# Patient Record
Sex: Male | Born: 1990 | Race: White | Hispanic: No | Marital: Single | Smoking: Current every day smoker
Health system: Southern US, Community
[De-identification: ages and names within clinical notes are randomized; demographics above are authoritative.]

## PROBLEM LIST (undated history)

## (undated) DIAGNOSIS — J45909 Unspecified asthma, uncomplicated: Secondary | ICD-10-CM

---
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
## 2012-11-29 ENCOUNTER — Emergency Department (HOSPITAL_COMMUNITY): Payer: Self-pay

## 2012-11-29 ENCOUNTER — Encounter (HOSPITAL_COMMUNITY): Payer: Self-pay | Admitting: *Deleted

## 2012-11-29 ENCOUNTER — Emergency Department (HOSPITAL_COMMUNITY)
Admission: EM | Admit: 2012-11-29 | Discharge: 2012-11-29 | Disposition: A | Discharge status: Home / Self Care | Payer: Self-pay | Attending: Emergency Medicine | Admitting: Emergency Medicine

## 2012-11-29 DIAGNOSIS — S4980XA Other specified injuries of shoulder and upper arm, unspecified arm, initial encounter: Secondary | ICD-10-CM | POA: Insufficient documentation

## 2012-11-29 DIAGNOSIS — S46909A Unspecified injury of unspecified muscle, fascia and tendon at shoulder and upper arm level, unspecified arm, initial encounter: Secondary | ICD-10-CM | POA: Insufficient documentation

## 2012-11-29 DIAGNOSIS — S8990XA Unspecified injury of unspecified lower leg, initial encounter: Secondary | ICD-10-CM | POA: Insufficient documentation

## 2012-11-29 DIAGNOSIS — Y9389 Activity, other specified: Secondary | ICD-10-CM | POA: Insufficient documentation

## 2012-11-29 DIAGNOSIS — M25469 Effusion, unspecified knee: Secondary | ICD-10-CM | POA: Insufficient documentation

## 2012-11-29 DIAGNOSIS — R269 Unspecified abnormalities of gait and mobility: Secondary | ICD-10-CM | POA: Insufficient documentation

## 2012-11-29 DIAGNOSIS — Y9289 Other specified places as the place of occurrence of the external cause: Secondary | ICD-10-CM | POA: Insufficient documentation

## 2012-11-29 DIAGNOSIS — S42009A Fracture of unspecified part of unspecified clavicle, initial encounter for closed fracture: Secondary | ICD-10-CM | POA: Insufficient documentation

## 2012-11-29 DIAGNOSIS — F17200 Nicotine dependence, unspecified, uncomplicated: Secondary | ICD-10-CM | POA: Insufficient documentation

## 2012-11-29 MED ORDER — OXYCODONE-ACETAMINOPHEN 5-325 MG PO TABS: 1.0000 | ORAL_TABLET | ORAL | Status: DC | PRN

## 2012-11-29 NOTE — ED Notes (Addendum)
Pt educated on hypertension and strongly advised a pcp. Did not want w/c
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
## 2012-11-29 NOTE — ED Provider Notes (Signed)
Medical screening examination/treatment/procedure(s) were conducted as a shared visit with non-physician practitioner(s) and myself.  I personally evaluated the patient during the encounter  Orthopedic follow up. Sling and knee immobilizer for comfort. No signs of infection. Deep abrasion to knee, no indication to close. Local wound care instructions given to pt by me personally.   Lyanne Co, MD 11/29/12 [phone]
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
## 2014-10-19 ENCOUNTER — Emergency Department (HOSPITAL_COMMUNITY): Payer: Self-pay

## 2014-10-19 ENCOUNTER — Encounter (HOSPITAL_COMMUNITY): Payer: Self-pay | Admitting: Emergency Medicine

## 2014-10-19 ENCOUNTER — Inpatient Hospital Stay (HOSPITAL_COMMUNITY)
Admission: EM | Admit: 2014-10-19 | Discharge: 2014-10-21 | DRG: 853 | Discharge status: Against Medical Advice | Payer: Self-pay | Attending: Internal Medicine | Admitting: Internal Medicine

## 2014-10-19 DIAGNOSIS — F1510 Other stimulant abuse, uncomplicated: Secondary | ICD-10-CM | POA: Diagnosis present

## 2014-10-19 DIAGNOSIS — T79A0XA Compartment syndrome, unspecified, initial encounter: Secondary | ICD-10-CM | POA: Diagnosis present

## 2014-10-19 DIAGNOSIS — B999 Unspecified infectious disease: Secondary | ICD-10-CM

## 2014-10-19 DIAGNOSIS — M726 Necrotizing fasciitis: Secondary | ICD-10-CM | POA: Diagnosis present

## 2014-10-19 DIAGNOSIS — A419 Sepsis, unspecified organism: Principal | ICD-10-CM | POA: Diagnosis present

## 2014-10-19 DIAGNOSIS — M609 Myositis, unspecified: Secondary | ICD-10-CM | POA: Diagnosis present

## 2014-10-19 DIAGNOSIS — J45909 Unspecified asthma, uncomplicated: Secondary | ICD-10-CM | POA: Diagnosis present

## 2014-10-19 DIAGNOSIS — F1020 Alcohol dependence, uncomplicated: Secondary | ICD-10-CM | POA: Diagnosis present

## 2014-10-19 DIAGNOSIS — F1210 Cannabis abuse, uncomplicated: Secondary | ICD-10-CM | POA: Diagnosis present

## 2014-10-19 DIAGNOSIS — F1910 Other psychoactive substance abuse, uncomplicated: Secondary | ICD-10-CM | POA: Diagnosis present

## 2014-10-19 DIAGNOSIS — F1410 Cocaine abuse, uncomplicated: Secondary | ICD-10-CM | POA: Diagnosis present

## 2014-10-19 DIAGNOSIS — R062 Wheezing: Secondary | ICD-10-CM

## 2014-10-19 DIAGNOSIS — Y909 Presence of alcohol in blood, level not specified: Secondary | ICD-10-CM | POA: Diagnosis present

## 2014-10-19 DIAGNOSIS — F17210 Nicotine dependence, cigarettes, uncomplicated: Secondary | ICD-10-CM | POA: Diagnosis present

## 2014-10-19 DIAGNOSIS — L03113 Cellulitis of right upper limb: Secondary | ICD-10-CM

## 2014-10-19 HISTORY — DX: Unspecified asthma, uncomplicated: J45.909

## 2014-10-19 LAB — COMPREHENSIVE METABOLIC PANEL
ALK PHOS: 66 U/L (ref 39–117)
ALT: 97 U/L — ABNORMAL HIGH (ref 0–53)
ANION GAP: 11 (ref 5–15)
AST: 67 U/L — ABNORMAL HIGH (ref 0–37)
Albumin: 3.9 g/dL (ref 3.5–5.2)
BILIRUBIN TOTAL: 0.9 mg/dL (ref 0.3–1.2)
BUN: 8 mg/dL (ref 6–23)
CHLORIDE: 102 meq/L (ref 96–112)
CO2: 23 mmol/L (ref 19–32)
CREATININE: 0.75 mg/dL (ref 0.50–1.35)
Calcium: 9.5 mg/dL (ref 8.4–10.5)
GLUCOSE: 122 mg/dL — AB (ref 70–99)
POTASSIUM: 4.4 mmol/L (ref 3.5–5.1)
Sodium: 136 mmol/L (ref 135–145)
Total Protein: 7.3 g/dL (ref 6.0–8.3)

## 2014-10-19 LAB — CBC WITH DIFFERENTIAL/PLATELET
Basophils Absolute: 0 10*3/uL (ref 0.0–0.1)
Basophils Relative: 0 % (ref 0–1)
EOS PCT: 0 % (ref 0–5)
Eosinophils Absolute: 0 10*3/uL (ref 0.0–0.7)
HEMATOCRIT: 44.1 % (ref 39.0–52.0)
Hemoglobin: 15.1 g/dL (ref 13.0–17.0)
LYMPHS ABS: 1.1 10*3/uL (ref 0.7–4.0)
Lymphocytes Relative: 4 % — ABNORMAL LOW (ref 12–46)
MCH: 31.7 pg (ref 26.0–34.0)
MCHC: 34.2 g/dL (ref 30.0–36.0)
MCV: 92.5 fL (ref 78.0–100.0)
MONO ABS: 1.9 10*3/uL — AB (ref 0.1–1.0)
MONOS PCT: 7 % (ref 3–12)
NEUTROS ABS: 24.6 10*3/uL — AB (ref 1.7–7.7)
Neutrophils Relative %: 89 % — ABNORMAL HIGH (ref 43–77)
PLATELETS: 312 10*3/uL (ref 150–400)
RBC: 4.77 MIL/uL (ref 4.22–5.81)
RDW: 13.4 % (ref 11.5–15.5)
WBC: 27.6 10*3/uL — AB (ref 4.0–10.5)

## 2014-10-19 LAB — LACTIC ACID, PLASMA: LACTIC ACID, VENOUS: 3.1 mmol/L — AB (ref 0.5–2.2)

## 2014-10-19 LAB — CK: Total CK: 80 U/L (ref 7–232)

## 2014-10-19 MED ORDER — PIPERACILLIN-TAZOBACTAM 3.375 G IVPB 30 MIN
3.3750 g | Freq: Once | INTRAVENOUS | Status: AC
Start: 2014-10-19 — End: 2014-10-19
  Administered 2014-10-19: 3.375 g via INTRAVENOUS
  Filled 2014-10-19: qty 50

## 2014-10-19 MED ORDER — ONDANSETRON HCL 4 MG/2ML IJ SOLN
4.0000 mg | Freq: Once | INTRAMUSCULAR | Status: AC
  Administered 2014-10-19: 4 mg via INTRAVENOUS
  Filled 2014-10-19: qty 2

## 2014-10-19 MED ORDER — HYDROMORPHONE HCL 1 MG/ML IJ SOLN
1.0000 mg | Freq: Once | INTRAMUSCULAR | Status: AC
  Administered 2014-10-19: 1 mg via INTRAVENOUS
  Filled 2014-10-19: qty 1

## 2014-10-19 MED ORDER — MORPHINE SULFATE 4 MG/ML IJ SOLN
4.0000 mg | Freq: Once | INTRAMUSCULAR | Status: AC
  Administered 2014-10-19: 4 mg via INTRAVENOUS
  Filled 2014-10-19: qty 1

## 2014-10-19 MED ORDER — VANCOMYCIN HCL IN DEXTROSE 1-5 GM/200ML-% IV SOLN
1000.0000 mg | Freq: Once | INTRAVENOUS | Status: AC
  Administered 2014-10-19: 1000 mg via INTRAVENOUS
  Filled 2014-10-19: qty 200

## 2014-10-19 MED ORDER — GADOBENATE DIMEGLUMINE 529 MG/ML IV SOLN
10.0000 mL | Freq: Once | INTRAVENOUS | Status: AC | PRN
  Administered 2014-10-19: 10 mL via INTRAVENOUS

## 2014-10-19 MED ORDER — MORPHINE SULFATE 10 MG/ML IJ SOLN
10.0000 mg | Freq: Once | INTRAMUSCULAR | Status: AC
  Administered 2014-10-19: 10 mg via INTRAVENOUS
  Filled 2014-10-19: qty 1

## 2014-10-19 MED ORDER — MORPHINE SULFATE 2 MG/ML IJ SOLN
2.0000 mg | Freq: Once | INTRAMUSCULAR | Status: AC
  Administered 2014-10-19: 2 mg via INTRAVENOUS
  Filled 2014-10-19: qty 1
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
## 2014-10-19 NOTE — ED Provider Notes (Signed)
Face-to-face evaluation   History: He complains of swelling and pain in his right arm after being injected with "salt solution" one week ago.  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  His friends thought that the saline injection would help him.  He has had gradually worse pain and swelling, since that time.   Physical exam: Alert, cooperative, uncomfortable.  Right arm is diffusely swollen from the midhumerus, to the wrist.  There is significant tenderness and erythema, medially, from the mid forearm to the midhumerus.  He has pain with extension and flexion of the right arm.  His neurovascular intact distally in the right.  There is firm induration of the medial proximal forearm and the medial distal humerus.     Medications  morphine injection 10 mg (10 mg Intravenous Given 10/19/14 1731)  ondansetron (ZOFRAN) injection 4 mg (4 mg Intravenous Given 10/19/14 1731)  HYDROmorphone (DILAUDID) injection 1 mg (1 mg Intravenous Given 10/19/14 1927)  vancomycin (VANCOCIN) IVPB 1000 mg/200 mL premix (0 mg Intravenous Stopped 10/19/14 2112)  piperacillin-tazobactam (ZOSYN) IVPB 3.375 g (0 g Intravenous Stopped 10/19/14 2007)  gadobenate dimeglumine (MULTIHANCE) injection 10 mL (10 mLs Intravenous Contrast Given 10/19/14 2215)  morphine 2 MG/ML injection 2 mg (2 mg Intravenous Given 10/19/14 2231)  morphine 4 MG/ML injection 4 mg (4 mg Intravenous Given 10/19/14 2303)    Patient Vitals for the past 24 hrs:  BP Temp Temp src Pulse Resp SpO2 Height Weight  10/19/14 2230 111/80 mmHg - - 107 - 100 % - -  10/19/14 2220 134/100 mmHg - - 100 - 98 % - -  10/19/14 1800 - - - 93 - 100 % - -  10/19/14 1745 - - - 96 - 100 % - -  10/19/14 1450 126/92 mmHg 99.1 F (37.3 C) Oral 115 18 100 % 5\' 4"  (1.626 m) 135 lb (61.236 kg)   Results for orders placed or performed during the hospital encounter of 10/19/14  CBC with Differential  Result Value Ref Range   WBC 27.6 (H) 4.0 - 10.5 K/uL   RBC 4.77  4.22 - 5.81 MIL/uL   Hemoglobin 15.1 13.0 - 17.0 g/dL   HCT 16.144.1 09.639.0 - 04.552.0 %   MCV 92.5 78.0 - 100.0 fL   MCH 31.7 26.0 - 34.0 pg   MCHC 34.2 30.0 - 36.0 g/dL   RDW 40.913.4 81.111.5 - 91.415.5 %   Platelets 312 150 - 400 K/uL   Neutrophils Relative % 89 (H) 43 - 77 %   Lymphocytes Relative 4 (L) 12 - 46 %   Monocytes Relative 7 3 - 12 %   Eosinophils Relative 0 0 - 5 %   Basophils Relative 0 0 - 1 %   Neutro Abs 24.6 (H) 1.7 - 7.7 K/uL   Lymphs Abs 1.1 0.7 - 4.0 K/uL   Monocytes Absolute 1.9 (H) 0.1 - 1.0 K/uL   Eosinophils Absolute 0.0 0.0 - 0.7 K/uL   Basophils Absolute 0.0 0.0 - 0.1 K/uL   WBC Morphology WHITE COUNT CONFIRMED ON SMEAR    Smear Review LARGE PLATELETS PRESENT   Comprehensive metabolic panel  Result Value Ref Range   Sodium 136 135 - 145 mmol/L   Potassium 4.4 3.5 - 5.1 mmol/L   Chloride 102 96 - 112 mEq/L   CO2 23 19 - 32 mmol/L   Glucose, Bld 122 (H) 70 - 99 mg/dL   BUN 8 6 - 23 mg/dL   Creatinine, Ser 7.820.75 0.50 - 1.35 mg/dL  Calcium 9.5 8.4 - 10.5 mg/dL   Total Protein 7.3 6.0 - 8.3 g/dL   Albumin 3.9 3.5 - 5.2 g/dL   AST 67 (H) 0 - 37 U/L   ALT 97 (H) 0 - 53 U/L   Alkaline Phosphatase 66 39 - 117 U/L   Total Bilirubin 0.9 0.3 - 1.2 mg/dL   GFR calc non Af Amer >90 >90 mL/min   GFR calc Af Amer >90 >90 mL/min   Anion gap 11 5 - 15  Lactic acid, plasma  Result Value Ref Range   Lactic Acid, Venous 3.1 (H) 0.5 - 2.2 mmol/L  CK  Result Value Ref Range   Total CK 80 7 - 232 U/L    Dg Chest 2 View  10/19/2014   CLINICAL DATA:  Wheezing.  Right arm pain and swelling.  EXAM: CHEST  2 VIEW  COMPARISON:  None.  FINDINGS: The heart size and mediastinal contours are within normal limits. Both lungs are clear. No evidence of pleural effusion. Old right clavicle fracture deformity incidentally noted.  IMPRESSION: No active cardiopulmonary disease.   Electronically Signed   By: Myles RosenthalJohn  Stahl M.D.   On: 10/19/2014 17:43   Dg Forearm Right  10/19/2014   CLINICAL DATA:   Right arm pain, swelling, and erythema. IV drug use.  EXAM: RIGHT FOREARM - 2 VIEW  COMPARISON:  None.  FINDINGS: Subcutaneous emphysema is seen in the proximal forearm. No evidence of osteolysis or periostitis. No fracture or other bone lesion seen involving the radius or ulna. No radiopaque foreign body identified.  IMPRESSION: Subcutaneous emphysema and proximal forearm. No osseous abnormality visualized.   Electronically Signed   By: Myles RosenthalJohn  Stahl M.D.   On: 10/19/2014 17:49    11:38 PM Reevaluation with update and discussion. After initial assessment and treatment, an updated evaluation reveals he is more comfortable at this time.  Patient updated on findings.Mancel Bale. Diva Lemberger L   23:50- [facility] [place]lyn LotKuzma; who agrees to evaluate the patient and likely take him to the operating room for operative management of necrotizing fasciitis.  He requests that the patient be admitted to a hospitalists service   00:10- according to Dr. Sharl MaLama, the hospitalist needs to be consulted after an operative procedure, which apparently, Dr. Merlyn LotKuzma is planning on doing.   CRITICAL CARE Performed by: Flint MelterWENTZ,Jeramia Saleeby L Total critical care time: 50 minutes Critical care time was exclusive of separately billable procedures and treating other patients. Critical care was necessary to treat or prevent imminent or life-threatening deterioration. Critical care was time spent personally by me on the following activities: development of treatment plan with patient and/or surrogate as well as nursing, discussions with consultants, evaluation of patient's response to treatment, examination of patient, obtaining history from patient or surrogate, ordering and performing treatments and interventions, ordering and review of laboratory studies, ordering and review of radiographic studies, pulse oximetry and re-evaluation of patient's condition.  Medical screening  examination/treatment/procedure(s) were conducted as a shared visit with non-physician practitioner(s) and myself.  I personally evaluated the patient during the encounter  Flint MelterElliott L Veona Bittman, MD 10/21/14 1154

## 2014-10-19 NOTE — ED Provider Notes (Signed)
CSN: [id number]     Arrival date & time 10/19/14  1445 History   First MD Initiated Contact with Patient 10/19/14 1639     Chief Complaint  Patient presents with  . Wound Infection     (Consider location/radiation/quality/duration/timing/severity/associated sxs/prior Treatment) HPI  Jared SangerRobert Cates is 23 y.o. 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 Initial evaluation by Ivery QualeHobson Bryant, PAC. Patient awaiting MRI of his RUE. Continues to complain of pain and request pain medication. He has had Morphine for pain and continues to complain of pain. 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 Dr. Effie ShyWentz has ordered additional medication for pain @ 1930. Dilaudid 1 mg IV.   Past Medical History  Diagnosis Date  . Asthma    History reviewed. No pertinent past surgical history. History reviewed. No pertinent family history. History  Substance Use Topics  . Smoking status: Current Every Day Smoker -- 1.00 packs/day    Types: Cigarettes  . Smokeless tobacco: Current User  . Alcohol Use: Yes     Comment: Pt reports he drinks about a hundred dollars wotrth of beer a week    Review of Systems As stated previously  Allergies  Review of patient's allergies indicates no known allergies.  Home Medications   Prior to Admission medications   Medication Sig Start Date End Date Taking? Authorizing Provider  ibuprofen (ADVIL,MOTRIN) 200 MG tablet Take 1,000 mg by mouth every 6 (six) hours as needed. For pain   Yes Historical Provider, MD  oxyCODONE-acetaminophen (PERCOCET/ROXICET) 5-325 MG per tablet Take 1 tablet by mouth every 4 (four) hours as needed for pain. Patient not taking: Reported on 10/19/2014 11/29/12   Janne NapoleonHope M Taytum Scheck, NP   BP 126/92 mmHg  Pulse 93  Temp(Src) 99.1 F (37.3 C) (Oral)  Resp 18  Ht 5\' 4"  (1.626 m)  Wt 135 lb (61.236 kg)  BMI 23.16 kg/m2  SpO2 100% Physical Exam  Constitutional: He is oriented to  person, place, and time. He appears well-developed and well-nourished. No distress.  HENT:  Head: Normocephalic and atraumatic.  Eyes: EOM are normal.  Neck: Neck supple.  Cardiovascular:  Slightly elevated BP, patient tachycardic on arrival but since then HR normal.   Pulmonary/Chest: Effort normal.  Abdominal: Soft. There is no tenderness.  Musculoskeletal:  Right forearm with cellulitis as described by Ivery QualeHobson Bryant in previous note.   Neurological: He is alert and oriented to person, place, and time. No cranial nerve deficit.  Skin: Skin is warm and dry.  Psychiatric: He has a normal mood and affect. His behavior is normal.  Nursing note and vitals reviewed.   ED Course  Procedures (including critical care time) Results for orders placed or performed during the hospital encounter of 10/19/14 (from the past 24 hour(s))  CBC with Differential     Status: Abnormal   Collection Time: 10/19/14  5:10 PM  Result Value Ref Range   WBC 27.6 (H) 4.0 - 10.5 K/uL   RBC 4.77 4.22 - 5.81 MIL/uL   Hemoglobin 15.1 13.0 - 17.0 g/dL   HCT 08.644.1 57.839.0 - 46.952.0 %   MCV 92.5 78.0 - 100.0 fL   MCH 31.7 26.0 - 34.0 pg   MCHC 34.2 30.0 - 36.0 g/dL   RDW 62.913.4 52.811.5 - 41.315.5 %   Platelets 312 150 - 400 K/uL   Neutrophils Relative % 89 (H) 43 - 77 %  Lymphocytes Relative 4 (L) 12 - 46 %   Monocytes Relative 7 3 - 12 %   Eosinophils Relative 0 0 - 5 %   Basophils Relative 0 0 - 1 %   Neutro Abs 24.6 (H) 1.7 - 7.7 K/uL   Lymphs Abs 1.1 0.7 - 4.0 K/uL   Monocytes Absolute 1.9 (H) 0.1 - 1.0 K/uL   Eosinophils Absolute 0.0 0.0 - 0.7 K/uL   Basophils Absolute 0.0 0.0 - 0.1 K/uL   WBC Morphology WHITE COUNT CONFIRMED ON SMEAR    Smear Review LARGE PLATELETS PRESENT   Comprehensive metabolic panel     Status: Abnormal   Collection Time: 10/19/14  5:10 PM  Result Value Ref Range   Sodium 136 135 - 145 mmol/L   Potassium 4.4 3.5 - 5.1 mmol/L   Chloride 102 96 - 112 mEq/L   CO2 23 19 - 32 mmol/L    Glucose, Bld 122 (H) 70 - 99 mg/dL   BUN 8 6 - 23 mg/dL   Creatinine, Ser 9.600.75 0.50 - 1.35 mg/dL   Calcium 9.5 8.4 - 45.410.5 mg/dL   Total Protein 7.3 6.0 - 8.3 g/dL   Albumin 3.9 3.5 - 5.2 g/dL   AST 67 (H) 0 - 37 U/L   ALT 97 (H) 0 - 53 U/L   Alkaline Phosphatase 66 39 - 117 U/L   Total Bilirubin 0.9 0.3 - 1.2 mg/dL   GFR calc non Af Amer >90 >90 mL/min   GFR calc Af Amer >90 >90 mL/min   Anion gap 11 5 - 15  CK     Status: None   Collection Time: 10/19/14  5:10 PM  Result Value Ref Range   Total CK 80 7 - 232 U/L  Lactic acid, plasma     Status: Abnormal   Collection Time: 10/19/14  6:10 PM  Result Value Ref Range   Lactic Acid, Venous 3.1 (H) 0.5 - 2.2 mmol/L  Blood cultures pending,    Imaging Review Dg Chest 2 View  10/19/2014   CLINICAL DATA:  Wheezing.  Right arm pain and swelling.  EXAM: CHEST  2 VIEW  COMPARISON:  None.  FINDINGS: The heart size and mediastinal contours are within normal limits. Both lungs are clear. No evidence of pleural effusion. Old right clavicle fracture deformity incidentally noted.  IMPRESSION: No active cardiopulmonary disease.   Electronically Signed   By: Myles RosenthalJohn  Stahl M.D.   On: 10/19/2014 17:43   Dg Forearm Right  10/19/2014   CLINICAL DATA:  Right arm pain, swelling, and erythema. IV drug use.  EXAM: RIGHT FOREARM - 2 VIEW  COMPARISON:  None.  FINDINGS: Subcutaneous emphysema is seen in the proximal forearm. No evidence of osteolysis or periostitis. No fracture or other bone lesion seen involving the radius or ulna. No radiopaque foreign body identified.  IMPRESSION: Subcutaneous emphysema and proximal forearm. No osseous abnormality visualized.   Electronically Signed   By: Myles RosenthalJohn  Stahl M.D.   On: 10/19/2014 17:49   Patient continues to complain of severe pain to his right arm. Continue pain management. Patient states that the Morphine worked better than the dilaudid for his pain.     MDM  Dr. Effie ShyWentz reviewed the MR report and the patient has  necrotizing fasciitis. [facility]. Patient remains neurovascularly intact at this time.      [id number]Kalijah Zeiss NassawadoxM Kaelee Pfeffer, NP 10/19/14 2346  Flint MelterElliott L Wentz, MD 10/21/14 1155

## 2014-10-20 ENCOUNTER — Encounter (HOSPITAL_COMMUNITY)
Admission: EM | Discharge status: Against Medical Advice | Payer: Self-pay | Source: Home / Self Care | Attending: Internal Medicine

## 2014-10-20 ENCOUNTER — Emergency Department (HOSPITAL_COMMUNITY): Payer: Self-pay | Admitting: Certified Registered"

## 2014-10-20 ENCOUNTER — Encounter (HOSPITAL_COMMUNITY): Payer: Self-pay | Admitting: Internal Medicine

## 2014-10-20 DIAGNOSIS — M726 Necrotizing fasciitis: Secondary | ICD-10-CM

## 2014-10-20 DIAGNOSIS — L03113 Cellulitis of right upper limb: Secondary | ICD-10-CM

## 2014-10-20 DIAGNOSIS — A419 Sepsis, unspecified organism: Principal | ICD-10-CM

## 2014-10-20 DIAGNOSIS — F1910 Other psychoactive substance abuse, uncomplicated: Secondary | ICD-10-CM

## 2014-10-20 HISTORY — PX: I&D EXTREMITY: SHX5045

## 2014-10-20 LAB — CBC WITH DIFFERENTIAL/PLATELET
BASOS PCT: 0 % (ref 0–1)
Basophils Absolute: 0 10*3/uL (ref 0.0–0.1)
EOS ABS: 0 10*3/uL (ref 0.0–0.7)
Eosinophils Relative: 0 % (ref 0–5)
HCT: 40.3 % (ref 39.0–52.0)
Hemoglobin: 13.6 g/dL (ref 13.0–17.0)
Lymphocytes Relative: 5 % — ABNORMAL LOW (ref 12–46)
Lymphs Abs: 0.9 10*3/uL (ref 0.7–4.0)
MCH: 30.8 pg (ref 26.0–34.0)
MCHC: 33.7 g/dL (ref 30.0–36.0)
MCV: 91.2 fL (ref 78.0–100.0)
Monocytes Absolute: 1.4 10*3/uL — ABNORMAL HIGH (ref 0.1–1.0)
Monocytes Relative: 7 % (ref 3–12)
NEUTROS PCT: 88 % — AB (ref 43–77)
Neutro Abs: 18.1 10*3/uL — ABNORMAL HIGH (ref 1.7–7.7)
PLATELETS: 250 10*3/uL (ref 150–400)
RBC: 4.42 MIL/uL (ref 4.22–5.81)
RDW: 13.3 % (ref 11.5–15.5)
WBC: 20.5 10*3/uL — ABNORMAL HIGH (ref 4.0–10.5)

## 2014-10-20 LAB — GRAM STAIN

## 2014-10-20 LAB — COMPREHENSIVE METABOLIC PANEL
ALK PHOS: 62 U/L (ref 39–117)
ALT: 67 U/L — AB (ref 0–53)
AST: 38 U/L — ABNORMAL HIGH (ref 0–37)
Albumin: 2.9 g/dL — ABNORMAL LOW (ref 3.5–5.2)
Anion gap: 9 (ref 5–15)
BUN: 5 mg/dL — ABNORMAL LOW (ref 6–23)
CALCIUM: 8.3 mg/dL — AB (ref 8.4–10.5)
CO2: 22 mmol/L (ref 19–32)
Chloride: 100 mEq/L (ref 96–112)
Creatinine, Ser: 0.8 mg/dL (ref 0.50–1.35)
Glucose, Bld: 131 mg/dL — ABNORMAL HIGH (ref 70–99)
POTASSIUM: 3.6 mmol/L (ref 3.5–5.1)
SODIUM: 131 mmol/L — AB (ref 135–145)
Total Bilirubin: 0.3 mg/dL (ref 0.3–1.2)
Total Protein: 5.6 g/dL — ABNORMAL LOW (ref 6.0–8.3)

## 2014-10-20 LAB — HEPATITIS PANEL, ACUTE
HCV Ab: NEGATIVE
Hep A IgM: NONREACTIVE
Hep B C IgM: NONREACTIVE
Hepatitis B Surface Ag: NEGATIVE

## 2014-10-20 LAB — HIV ANTIBODY (ROUTINE TESTING W REFLEX): HIV: NONREACTIVE

## 2014-10-20 LAB — CK: CK TOTAL: 210 U/L (ref 7–232)

## 2014-10-20 SURGERY — IRRIGATION AND DEBRIDEMENT EXTREMITY
Anesthesia: General | Site: Arm Lower | Laterality: Right

## 2014-10-20 MED ORDER — LORAZEPAM 2 MG/ML IJ SOLN: 0.0000 mg | Freq: Two times a day (BID) | INTRAMUSCULAR | Status: DC

## 2014-10-20 MED ORDER — LORAZEPAM 1 MG PO TABS
1.0000 mg | ORAL_TABLET | Freq: Four times a day (QID) | ORAL | Status: DC | PRN
  Administered 2014-10-20 (×2): 1 mg via ORAL
  Filled 2014-10-20 (×2): qty 1

## 2014-10-20 MED ORDER — OXYCODONE HCL 5 MG PO TABS
10.0000 mg | ORAL_TABLET | Freq: Four times a day (QID) | ORAL | Status: DC | PRN
  Administered 2014-10-20 – 2014-10-21 (×3): 10 mg via ORAL
  Filled 2014-10-20 (×3): qty 2

## 2014-10-20 MED ORDER — OXYCODONE HCL 5 MG/5ML PO SOLN: 5.0000 mg | Freq: Once | ORAL | Status: AC | PRN

## 2014-10-20 MED ORDER — OXYCODONE HCL 5 MG PO TABS
5.0000 mg | ORAL_TABLET | Freq: Once | ORAL | Status: AC | PRN
  Administered 2014-10-20: 5 mg via ORAL

## 2014-10-20 MED ORDER — HYDROMORPHONE HCL 1 MG/ML IJ SOLN
0.2500 mg | INTRAMUSCULAR | Status: DC | PRN
  Administered 2014-10-20 (×2): 0.5 mg via INTRAVENOUS

## 2014-10-20 MED ORDER — LORAZEPAM 2 MG/ML IJ SOLN
INTRAMUSCULAR | Status: AC
  Administered 2014-10-20: 1 mg
  Filled 2014-10-20: qty 1

## 2014-10-20 MED ORDER — PIPERACILLIN-TAZOBACTAM 3.375 G IVPB
3.3750 g | Freq: Three times a day (TID) | INTRAVENOUS | Status: DC
  Administered 2014-10-20 – 2014-10-21 (×4): 3.375 g via INTRAVENOUS
  Filled 2014-10-20 (×7): qty 50

## 2014-10-20 MED ORDER — GLYCOPYRROLATE 0.2 MG/ML IJ SOLN
INTRAMUSCULAR | Status: DC | PRN
  Administered 2014-10-20: .6 mg via INTRAVENOUS

## 2014-10-20 MED ORDER — MORPHINE SULFATE 2 MG/ML IJ SOLN
2.0000 mg | INTRAMUSCULAR | Status: DC | PRN
  Administered 2014-10-20 – 2014-10-21 (×7): 2 mg via INTRAVENOUS
  Filled 2014-10-20 (×8): qty 1

## 2014-10-20 MED ORDER — LORAZEPAM 2 MG/ML IJ SOLN
0.0000 mg | Freq: Four times a day (QID) | INTRAMUSCULAR | Status: DC
  Administered 2014-10-20: 1 mg via INTRAVENOUS
  Administered 2014-10-21: 2 mg via INTRAVENOUS
  Filled 2014-10-20 (×2): qty 1

## 2014-10-20 MED ORDER — SODIUM CHLORIDE 0.9 % IV SOLN
INTRAVENOUS | Status: DC
  Administered 2014-10-20: 07:00:00 via INTRAVENOUS

## 2014-10-20 MED ORDER — ONDANSETRON HCL 4 MG/2ML IJ SOLN
INTRAMUSCULAR | Status: DC | PRN
  Administered 2014-10-20: 4 mg via INTRAVENOUS

## 2014-10-20 MED ORDER — ADULT MULTIVITAMIN W/MINERALS CH
1.0000 | ORAL_TABLET | Freq: Every day | ORAL | Status: DC
  Administered 2014-10-20: 1 via ORAL
  Filled 2014-10-20 (×2): qty 1

## 2014-10-20 MED ORDER — PROMETHAZINE HCL 25 MG/ML IJ SOLN: 6.2500 mg | INTRAMUSCULAR | Status: DC | PRN

## 2014-10-20 MED ORDER — LORAZEPAM 2 MG/ML IJ SOLN: 1.0000 mg | Freq: Four times a day (QID) | INTRAMUSCULAR | Status: DC | PRN

## 2014-10-20 MED ORDER — LACTATED RINGERS IV SOLN
INTRAVENOUS | Status: DC | PRN
  Administered 2014-10-20: 04:00:00 via INTRAVENOUS

## 2014-10-20 MED ORDER — ONDANSETRON HCL 4 MG/2ML IJ SOLN: 4.0000 mg | Freq: Four times a day (QID) | INTRAMUSCULAR | Status: DC | PRN

## 2014-10-20 MED ORDER — FENTANYL CITRATE 0.05 MG/ML IJ SOLN
INTRAMUSCULAR | Status: DC | PRN
  Administered 2014-10-20 (×4): 100 ug via INTRAVENOUS
  Administered 2014-10-20: 150 ug via INTRAVENOUS
  Administered 2014-10-20: 50 ug via INTRAVENOUS

## 2014-10-20 MED ORDER — SODIUM CHLORIDE 0.9 % IR SOLN
Status: DC | PRN
  Administered 2014-10-20 (×2): 1000 mL
  Administered 2014-10-20: 3000 mL

## 2014-10-20 MED ORDER — SUCCINYLCHOLINE CHLORIDE 20 MG/ML IJ SOLN
INTRAMUSCULAR | Status: DC | PRN
  Administered 2014-10-20: 170 mg via INTRAVENOUS

## 2014-10-20 MED ORDER — ONDANSETRON HCL 4 MG PO TABS: 4.0000 mg | ORAL_TABLET | Freq: Four times a day (QID) | ORAL | Status: DC | PRN

## 2014-10-20 MED ORDER — MIDAZOLAM HCL 2 MG/2ML IJ SOLN
INTRAMUSCULAR | Status: DC | PRN
  Administered 2014-10-20: 2 mg via INTRAVENOUS

## 2014-10-20 MED ORDER — ACETAMINOPHEN 650 MG RE SUPP: 650.0000 mg | Freq: Four times a day (QID) | RECTAL | Status: DC | PRN

## 2014-10-20 MED ORDER — ROCURONIUM BROMIDE 100 MG/10ML IV SOLN
INTRAVENOUS | Status: DC | PRN
  Administered 2014-10-20: 40 mg via INTRAVENOUS

## 2014-10-20 MED ORDER — VITAMIN B-1 100 MG PO TABS
100.0000 mg | ORAL_TABLET | Freq: Every day | ORAL | Status: DC
  Administered 2014-10-20: 100 mg via ORAL
  Filled 2014-10-20 (×2): qty 1

## 2014-10-20 MED ORDER — LIDOCAINE HCL (CARDIAC) 20 MG/ML IV SOLN
INTRAVENOUS | Status: DC | PRN
  Administered 2014-10-20: 80 mg via INTRAVENOUS

## 2014-10-20 MED ORDER — ACETAMINOPHEN 325 MG PO TABS: 650.0000 mg | ORAL_TABLET | Freq: Four times a day (QID) | ORAL | Status: DC | PRN

## 2014-10-20 MED ORDER — SODIUM CHLORIDE 0.9 % IV SOLN
INTRAVENOUS | Status: DC | PRN
  Administered 2014-10-20: 02:00:00 via INTRAVENOUS

## 2014-10-20 MED ORDER — HYDROMORPHONE HCL 1 MG/ML IJ SOLN
1.0000 mg | Freq: Once | INTRAMUSCULAR | Status: AC
  Administered 2014-10-20: 1 mg via INTRAVENOUS
  Filled 2014-10-20: qty 1

## 2014-10-20 MED ORDER — NEOSTIGMINE METHYLSULFATE 10 MG/10ML IV SOLN
INTRAVENOUS | Status: DC | PRN
  Administered 2014-10-20: 4 mg via INTRAVENOUS

## 2014-10-20 MED ORDER — VANCOMYCIN HCL IN DEXTROSE 1-5 GM/200ML-% IV SOLN
1000.0000 mg | Freq: Three times a day (TID) | INTRAVENOUS | Status: DC
  Administered 2014-10-20 – 2014-10-21 (×4): 1000 mg via INTRAVENOUS
  Filled 2014-10-20 (×6): qty 200

## 2014-10-20 MED ORDER — FOLIC ACID 1 MG PO TABS
1.0000 mg | ORAL_TABLET | Freq: Every day | ORAL | Status: DC
  Administered 2014-10-20: 1 mg via ORAL
  Filled 2014-10-20 (×2): qty 1

## 2014-10-20 MED ORDER — THIAMINE HCL 100 MG/ML IJ SOLN
100.0000 mg | Freq: Every day | INTRAMUSCULAR | Status: DC
  Filled 2014-10-20: qty 1

## 2014-10-20 MED ORDER — PROPOFOL 10 MG/ML IV BOLUS
INTRAVENOUS | Status: DC | PRN
  Administered 2014-10-20: 170 mg via INTRAVENOUS
  Administered 2014-10-20: 30 mg via INTRAVENOUS

## 2014-10-20 SURGICAL SUPPLY — 55 items
BANDAGE COBAN STERILE 2 (GAUZE/BANDAGES/DRESSINGS) IMPLANT
BANDAGE ELASTIC 3 VELCRO ST LF (GAUZE/BANDAGES/DRESSINGS) ×3 IMPLANT
BANDAGE ELASTIC 4 VELCRO ST LF (GAUZE/BANDAGES/DRESSINGS) ×3 IMPLANT
BNDG COHESIVE 1X5 TAN STRL LF (GAUZE/BANDAGES/DRESSINGS) IMPLANT
BNDG CONFORM 2 STRL LF (GAUZE/BANDAGES/DRESSINGS) IMPLANT
BNDG ESMARK 4X9 LF (GAUZE/BANDAGES/DRESSINGS) IMPLANT
BNDG GAUZE ELAST 4 BULKY (GAUZE/BANDAGES/DRESSINGS) ×6 IMPLANT
CORDS BIPOLAR (ELECTRODE) ×3 IMPLANT
COVER SURGICAL LIGHT HANDLE (MISCELLANEOUS) ×3 IMPLANT
DECANTER SPIKE VIAL GLASS SM (MISCELLANEOUS) IMPLANT
DRAIN PENROSE 1/4X12 LTX STRL (WOUND CARE) IMPLANT
DRSG ADAPTIC 3X8 NADH LF (GAUZE/BANDAGES/DRESSINGS) IMPLANT
DRSG EMULSION OIL 3X3 NADH (GAUZE/BANDAGES/DRESSINGS) ×3 IMPLANT
DRSG PAD ABDOMINAL 8X10 ST (GAUZE/BANDAGES/DRESSINGS) ×3 IMPLANT
GAUZE IODOFORM PACK 1/2 7832 (GAUZE/BANDAGES/DRESSINGS) ×3 IMPLANT
GAUZE SPONGE 4X4 12PLY STRL (GAUZE/BANDAGES/DRESSINGS) ×6 IMPLANT
GAUZE XEROFORM 1X8 LF (GAUZE/BANDAGES/DRESSINGS) ×3 IMPLANT
GLOVE BIO SURGEON STRL SZ7.5 (GLOVE) ×3 IMPLANT
GLOVE BIOGEL M 8.0 STRL (GLOVE) ×3 IMPLANT
GLOVE BIOGEL PI IND STRL 8 (GLOVE) ×1 IMPLANT
GLOVE BIOGEL PI INDICATOR 8 (GLOVE) ×2
GOWN STRL REIN XL XLG (GOWN DISPOSABLE) ×3 IMPLANT
GOWN STRL REUS W/ TWL XL LVL3 (GOWN DISPOSABLE) ×1 IMPLANT
GOWN STRL REUS W/TWL XL LVL3 (GOWN DISPOSABLE) ×2
HANDPIECE INTERPULSE COAX TIP (DISPOSABLE)
KIT BASIN OR (CUSTOM PROCEDURE TRAY) ×3 IMPLANT
KIT ROOM TURNOVER OR (KITS) ×3 IMPLANT
LOOP VESSEL MAXI BLUE (MISCELLANEOUS) IMPLANT
LOOP VESSEL MINI RED (MISCELLANEOUS) IMPLANT
MANIFOLD NEPTUNE II (INSTRUMENTS) ×3 IMPLANT
NEEDLE HYPO 25X1 1.5 SAFETY (NEEDLE) IMPLANT
NS IRRIG 1000ML POUR BTL (IV SOLUTION) ×3 IMPLANT
PACK ORTHO EXTREMITY (CUSTOM PROCEDURE TRAY) ×3 IMPLANT
PAD ARMBOARD 7.5X6 YLW CONV (MISCELLANEOUS) ×6 IMPLANT
SCRUB BETADINE 4OZ XXX (MISCELLANEOUS) ×3 IMPLANT
SET CYSTO W/LG BORE CLAMP LF (SET/KITS/TRAYS/PACK) ×3 IMPLANT
SET HNDPC FAN SPRY TIP SCT (DISPOSABLE) IMPLANT
SOLUTION BETADINE 4OZ (MISCELLANEOUS) ×3 IMPLANT
SPLINT FIBERGLASS 4X30 (CAST SUPPLIES) ×3 IMPLANT
SPONGE LAP 18X18 X RAY DECT (DISPOSABLE) ×3 IMPLANT
SPONGE LAP 4X18 X RAY DECT (DISPOSABLE) ×3 IMPLANT
SUCTION FRAZIER TIP 10 FR DISP (SUCTIONS) ×3 IMPLANT
SUT ETHILON 2 0 FS 18 (SUTURE) ×3 IMPLANT
SUT ETHILON 4 0 PS 2 18 (SUTURE) IMPLANT
SUT MON AB 5-0 P3 18 (SUTURE) IMPLANT
SYR CONTROL 10ML LL (SYRINGE) IMPLANT
TOWEL OR 17X24 6PK STRL BLUE (TOWEL DISPOSABLE) ×3 IMPLANT
TOWEL OR 17X26 10 PK STRL BLUE (TOWEL DISPOSABLE) ×3 IMPLANT
TUBE ANAEROBIC SPECIMEN COL (MISCELLANEOUS) ×3 IMPLANT
TUBE CONNECTING 12'X1/4 (SUCTIONS) ×1
TUBE CONNECTING 12X1/4 (SUCTIONS) ×2 IMPLANT
TUBE FEEDING 5FR 15 INCH (TUBING) IMPLANT
UNDERPAD 30X30 INCONTINENT (UNDERPADS AND DIAPERS) ×3 IMPLANT
WATER STERILE IRR 1000ML POUR (IV SOLUTION) ×3 IMPLANT
YANKAUER SUCT BULB TIP NO VENT (SUCTIONS) ×3 IMPLANT

## 2014-10-20 NOTE — ED Notes (Signed)
Dr Kuzma at bedside. 

## 2014-10-20 NOTE — H&P (Signed)
[name] is an 23 y.o. male.   Chief Complaint: right arm infection HPI: 23 yo rhd male states he has had worsening issues with right arm starting 1 week ago.  OD'd while using heroin and friends injected a salt solution into his right arm.  Has had progressive pain, swelling, erythema.  Seen at [facility] today where he was felt to have necrotizing fasciitis.  MRI and XR show air in forearm and distal upper arm as well as myositis.  Reports fever yesterday.  No chills, night sweats.  Major complaint is pain in right arm.  Previous abscess in left arm that he drained himself.  Past Medical History  Diagnosis Date  . Asthma     History reviewed. No pertinent past surgical history.  History reviewed. No pertinent family history. Social History:  reports that he has been smoking Cigarettes.  He has been smoking about 1.00 pack per day. He uses smokeless tobacco. He reports that he drinks alcohol. He reports that he uses illicit drugs (Cocaine, Marijuana, Methamphetamines, and IV).  Allergies: No Known Allergies   (Not in a hospital admission)  Results for orders placed or performed during the hospital encounter of 10/19/14 (from the past 48 hour(s))  CBC with Differential     Status: Abnormal   Collection Time: 10/19/14  5:10 PM  Result Value Ref Range   WBC 27.6 (H) 4.0 - 10.5 K/uL   RBC 4.77 4.22 - 5.81 MIL/uL   Hemoglobin 15.1 13.0 - 17.0 g/dL   HCT 44.1 39.0 - 52.0 %   MCV 92.5 78.0 - 100.0 fL   MCH 31.7 26.0 - 34.0 pg   MCHC 34.2 30.0 - 36.0 g/dL   RDW 13.4 11.5 - 15.5 %   Platelets 312 150 - 400 K/uL   Neutrophils Relative % 89 (H) 43 - 77 %   Lymphocytes Relative 4 (L) 12 - 46 %   Monocytes Relative 7 3 - 12 %   Eosinophils Relative 0 0 - 5 %   Basophils Relative 0 0 - 1 %   Neutro Abs 24.6 (H) 1.7 - 7.7 K/uL   Lymphs Abs 1.1 0.7 - 4.0 K/uL   Monocytes Absolute 1.9 (H) 0.1 - 1.0 K/uL   Eosinophils Absolute 0.0 0.0 - 0.7 K/uL   Basophils Absolute 0.0 0.0 - 0.1 K/uL   WBC  Morphology WHITE COUNT CONFIRMED ON SMEAR     Comment: MILD LEFT SHIFT (1-5% METAS, OCC MYELO, OCC BANDS) ATYPICAL LYMPHOCYTES VACUOLATED NEUTROPHILS    Smear Review LARGE PLATELETS PRESENT   Comprehensive metabolic panel     Status: Abnormal   Collection Time: 10/19/14  5:10 PM  Result Value Ref Range   Sodium 136 135 - 145 mmol/L    Comment: Please note change in reference range.   Potassium 4.4 3.5 - 5.1 mmol/L    Comment: Please note change in reference range.   Chloride 102 96 - 112 mEq/L   CO2 23 19 - 32 mmol/L   Glucose, Bld 122 (H) 70 - 99 mg/dL   BUN 8 6 - 23 mg/dL   Creatinine, Ser 0.75 0.50 - 1.35 mg/dL   Calcium 9.5 8.4 - 10.5 mg/dL   Total Protein 7.3 6.0 - 8.3 g/dL   Albumin 3.9 3.5 - 5.2 g/dL   AST 67 (H) 0 - 37 U/L   ALT 97 (H) 0 - 53 U/L   Alkaline Phosphatase 66 39 - 117 U/L   Total Bilirubin 0.9 0.3 - 1.2  mg/dL   GFR calc non Af Amer >90 >90 mL/min   GFR calc Af Amer >90 >90 mL/min    Comment: (NOTE) The eGFR has been calculated using the CKD EPI equation. This calculation has not been validated in all clinical situations. eGFR's persistently <90 mL/min signify possible Chronic Kidney Disease.    Anion gap 11 5 - 15  CK     Status: None   Collection Time: 10/19/14  5:10 PM  Result Value Ref Range   Total CK 80 7 - 232 U/L  Lactic acid, plasma     Status: Abnormal   Collection Time: 10/19/14  6:10 PM  Result Value Ref Range   Lactic Acid, Venous 3.1 (H) 0.5 - 2.2 mmol/L    Dg Chest 2 View  10/19/2014   CLINICAL DATA:  Wheezing.  Right arm pain and swelling.  EXAM: CHEST  2 VIEW  COMPARISON:  None.  FINDINGS: The heart size and mediastinal contours are within normal limits. Both lungs are clear. No evidence of pleural effusion. Old right clavicle fracture deformity incidentally noted.  IMPRESSION: No active cardiopulmonary disease.   Electronically Signed   By: Earle Gell M.D.   On: 10/19/2014 17:43   Dg Forearm Right  10/19/2014   CLINICAL DATA:   Right arm pain, swelling, and erythema. IV drug use.  EXAM: RIGHT FOREARM - 2 VIEW  COMPARISON:  None.  FINDINGS: Subcutaneous emphysema is seen in the proximal forearm. No evidence of osteolysis or periostitis. No fracture or other bone lesion seen involving the radius or ulna. No radiopaque foreign body identified.  IMPRESSION: Subcutaneous emphysema and proximal forearm. No osseous abnormality visualized.   Electronically Signed   By: Earle Gell M.D.   On: 10/19/2014 17:49     A comprehensive review of systems was negative except for: Constitutional: positive for fevers  Blood pressure 132/77, pulse 104, temperature 100 F (37.8 C), temperature source Oral, resp. rate 20, height _0  (1.626 m), weight 61.236 kg (135 lb), SpO2 93 %.  General appearance: alert, cooperative and appears stated age Head: Normocephalic, without obvious abnormality, atraumatic Neck: supple, symmetrical, trachea midline Resp: clear to auscultation bilaterally Cardio: regular rate and rhythm GI: non tender Extremities: intact sensation and capillary refill all digits.  +epl/fpl/io.  able to move digits without significant pain.  hand/forearm/upper arm with swelling, erythema, warmth.  distal upper arm and proximal forearm tense. Pulses: 2+ and symmetric Skin: Skin color, texture, turgor normal. No rashes or lesions Neurologic: Grossly normal Incision/Wound: none  Assessment/Plan Right arm infection, possible necrotizing fasciitis.  Recommend OR for incision and drainage of hand/forearm/upper arm likely both volarly and dorsally.  Risks, benefits, and alternatives of surgery were discussed and the patient agrees with the plan of care.  Will plan admission to medical service post operatively for IVABx and medical management.   Gabino Hagin R 10/20/2014, 2:09 AM

## 2014-10-20 NOTE — Op Note (Signed)
NAMNorva Pavlov:  [name], [name]               ACCOUNT NO.:  [id number]  MEDICAL RECORD NO.:  [id number]  LOCATION:  [facility]                         FACILITY:  [facility]  PHYSICIAN:  Jared LoaKevin Bentli Llorente, MD        DATE OF BIRTH:  04-12-1991  DATE OF PROCEDURE:  10/20/2014 DATE OF DISCHARGE:                              OPERATIVE REPORT   PREOPERATIVE DIAGNOSIS:  Right forearm necrotizing fasciitis and upper arm necrotizing fasciitis.  POSTOPERATIVE DIAGNOSIS:  Right forearm and upper arm necrotizing fasciitis and compartment syndrome.  PROCEDURE:  Right forearm and upper arm incision and drainage and fasciotomy of volar forearm and volar upper arm.  SURGEON:  Jared LoaKevin Emali Heyward, MD.  ASSISTANT:  [name], M.D.  ANESTHESIA:  General.  IV FLUIDS:  Per anesthesia flow sheet.  ESTIMATED BLOOD LOSS:  Minimal.  COMPLICATIONS:  None.  SPECIMENS:  Cultures to micro.  TOURNIQUET TIME:  65 minutes.  DISPOSITION:  Stable to PACU.  INDICATIONS:  [name] is a 23 year old male, who states approximately a week ago he was using heroin when he overdosed and his friends gave him an injection of salt water to try and help him.  He has had increasing pain, swelling, and erythema of the right arm.  [facility] was [facility] at [facility], where he was evaluated and felt to have necrotizing fasciitis.  [facility].  On examination, he had a swollen erythematous right forearm and upper arm.  He was very tense.  He was tender to palpation.  I recommended incision and drainage and release of his compartments. Risks, benefits, and alternatives of surgery were discussed including the risk of blood loss, infection, damage to nerves, vessels, tendons, ligaments, bone; failure of surgery; need for additional surgery, complications with wound healing, continued pain, continued infection, need for repeat irrigation and debridement.  He voiced understanding of these risks and  elected to proceed.  OPERATIVE COURSE:  After being identified preoperatively by myself, the patient and I agreed upon procedure and site procedure.  Surgical site was marked.  The risks, benefits, and alternatives of surgery were reviewed and he wished to proceed.  Surgical consent had been signed. He was transported to the operating room and placed on the operating room table in supine position with the right upper extremity on arm board.  General anesthesia was induced by anesthesiologist.  The right upper extremity was prepped and draped in normal sterile orthopedic fashion.  Surgical pause was performed between surgeons, anesthesia, and operating room staff, and all were in agreement as to the patient procedure and site of procedure.  Tourniquet at the proximal aspect of the arm was inflated to 250 mmHg after exsanguination of the limb with an Esmarch bandage.  A incision was made on the forearm down the ulnar side for release of compartments.  This was extended across the antecubital fossa and up the radial side of the upper arm during the case.  The bipolar electrocautery and Bovie were used to obtain hemostasis.  The fascia was released.  The muscle was tense underneath it and decompressed.  The volar flap was elevated.  The more radial side of  the forearm gross purulence was encountered.  It was malodorous.  The muscle was damaged looking.  Some was necrotic.  Necrotic portions of muscle were debrided using rongeurs and pickups.  The purulence appeared to course along the course of the pronator teres and the radial artery. It also coursed into the biceps tendon and tracked along the biceps tendon.  The purulence also was found to course down to the proximal aspect of the radius.  Once all areas of purulence had been felt to be found, the wound was copiously irrigated with 4000 mL of sterile saline by cysto tubing.  The area was again debrided of any devitalized muscle. The volar  compartment of the forearm and the volar compartment of the upper arm had been released.  The abscess cavities were packed with 0.5 inch Iodoform gauze.  Two 2-0 nylon sutures were placed at the corners of the wound to prevent wound edge retraction.  The wounds were dressed with sterile 4x4s, ABDs and wrapped with a Kerlix bandage.  Posterior splint at the elbow was placed and wrapped with Kerlix and Ace bandage. The tourniquet was deflated at 65 minutes.  Fingertips were pink with brisk capillary refill after deflation of the tourniquet.  The operative drapes were broken down.  The patient was awoken from anesthesia safely. He was transferred back to stretcher and taken to PACU in stable condition.  He will be admitted by the hospitalist for medical care and antibiotics.  He will return to the operating room in 24-48 hours for repeat irrigation, debridement and dressing change.  He will be started on vancomycin and Zosyn.  Pain medications will be ordered by the admitting service.     Jared LoaKevin Dalinda Heidt, MD     KK/MEDQ  D:  10/20/2014  T:  10/20/2014  Job:  [id number]

## 2014-10-20 NOTE — Transfer of Care (Signed)
Immediate Anesthesia Transfer of Care Note  Patient: Jared SangerRobert [name]  Procedure(s) Performed: Procedure(s): INCISION AND DRAINAGE RIGHT ARM, FASCIOTOMY RIGHT FOREARM (Right)  Patient Location: PACU  Anesthesia Type:General  Level of Consciousness: awake, alert  and oriented  Airway & Oxygen Therapy: Patient Spontanous Breathing  Post-op Assessment: Report given to PACU RN, Post -op Vital signs reviewed and stable and Patient moving all extremities X 4  Post vital signs: Reviewed and stable  Complications: No apparent anesthesia complications

## 2014-10-20 NOTE — Anesthesia Procedure Notes (Signed)
Procedure Name: Intubation Date/Time: 10/20/2014 2:32 AM Performed by: Molli HazardGORDON, Brady Schiller M Pre-anesthesia Checklist: Patient identified, Emergency Drugs available, Suction available and Patient being monitored Patient Re-evaluated:Patient Re-evaluated prior to inductionOxygen Delivery Method: Circle system utilized Preoxygenation: Pre-oxygenation with 100% oxygen Intubation Type: IV induction, Rapid sequence and Cricoid Pressure applied Laryngoscope Size: Miller and 2 Grade View: Grade I Tube type: Oral Tube size: 8.0 mm Number of attempts: 1 Airway Equipment and Method: Stylet Placement Confirmation: ETT inserted through vocal cords under direct vision,  positive ETCO2 and breath sounds checked- equal and bilateral Secured at: 24 cm Tube secured with: Tape Dental Injury: Teeth and Oropharynx as per pre-operative assessment

## 2014-10-20 NOTE — Progress Notes (Signed)
ANTIBIOTIC CONSULT NOTE - INITIAL  Pharmacy Consult for Vancomycin/Zosyn  Indication: Cellulitis  No Known Allergies  Patient Measurements: Height: 5\' 4"  (162.6 cm) Weight: 135 lb (61.236 kg) IBW/kg (Calculated) : 59.2  Vital Signs: Temp: 100.3 F (37.9 C) (12/23 0456) Temp Source: Oral (12/23 0456) BP: 140/83 mmHg (12/23 0456) Pulse Rate: 103 (12/23 0456)  Labs:  Recent Labs  10/19/14 1710  WBC 27.6*  HGB 15.1  PLT 312  CREATININE 0.75   Estimated Creatinine Clearance: 121.3 mL/min (by C-G formula based on Cr of 0.75).  Microbiology: Recent Results (from the past 720 hour(s))  Gram stain     Status: None   Collection Time: 10/20/14  2:58 AM  Result Value Ref Range Status   Specimen Description WOUND RIGHT FOREARM  Final   Special Requests PATIENT ON FOLLOWING ZOSYN VANCOMYCIN  Final   Gram Stain   Final    ABUNDANT WBC PRESENT,BOTH PMN AND MONONUCLEAR ABUNDANT GRAM NEGATIVE RODS RARE GRAM POSITIVE RODS FEW GRAM POSITIVE COCCI IN PAIR AND CHAINS RARE YEAST    Report Status 10/20/2014 FINAL  Final    Medical History: Past Medical History  Diagnosis Date  . Asthma      Assessment: 23 y/o M to start broad spectrum antibiotics for R-arm infection secondary to IVDA. Pt is s/p OR with I&D and fasciotomy. WBC markedly elevated at 27.6, renal function good, other labs as above.   Goal of Therapy:  Vancomycin trough level 15-20 mcg/ml  Plan:  -Vancomycin 1000 mg IV q8h -Zosyn 3.375G IV q8h to be infused over 4 hours -Trend WBC, temp, renal function  -Drug levels as indicated   Abran DukeLedford, Georgeanna Radziewicz 10/20/2014,5:58 AM

## 2014-10-20 NOTE — Anesthesia Postprocedure Evaluation (Signed)
Anesthesia Post Note  Patient: Jared SangerRobert Windsor  Procedure(s) Performed: Procedure(s) (LRB): INCISION AND DRAINAGE RIGHT ARM, FASCIOTOMY RIGHT FOREARM (Right)  Anesthesia type: general  Patient location: PACU  Post pain: Pain level controlled  Post assessment: Patient's Cardiovascular Status Stable  Last Vitals:  Filed Vitals:   10/20/14 0456  BP: 140/83  Pulse: 103  Temp: 37.9 C  Resp: 14    Post vital signs: Reviewed and stable  Level of consciousness: sedated  Complications: No apparent anesthesia complications

## 2014-10-20 NOTE — Progress Notes (Addendum)
[facility] PROGRESS NOTE  Jared SangerRobert [name] MVH:[id number] DOB: 04/29/1991 DOA: 10/19/2014 PCP: No primary care provider on file.  Assessment/Plan: 1. Right forearm necrotizing fasciitis/upper arm necrotizing fasciitis/compartment syndrome -Patient having a history of polysubstance abuse, injecting into his right arm, likely cause of his infection. -He underwent incision and drainage and fasciotomy of right forearm and upper arm on 10/20/2014. -Tolerated procedure well there no immediate complications. -Continue empiric IV antimicrobial therapy with Vancomycin and Zosyn -Grams to stain showing abundant gram-negative rods, few gram-positive cocci -Cultures are pending  2.  Sepsis -Present on admission, evidenced by a white count of 20,500, lactic acid of 3.1, heart rate of 111 -Secondary to right forearm necrotizing fasciitis -Status post ORIF and fasciotomy -Follow-up on blood cultures, continue empiric IV antimicrobial therapy with vancomycin and Zosyn  3.  History of alcohol abuse -He did not appear to have clinical signs or symptoms of acute withdrawal during my encounter this morning -Placed on CIWA protocol  4.  Polysubstance abuse -Social work consult  Code Status: Full code Family Communication:  Disposition Plan: Continue IV antimicrobial therapy, at discharge home when stable   Consultants:  Orthopedic surgery  Procedures:  Incision and drainage and fasciotomy of right forearm and upper arm on 10/20/2014.  Antibiotics:  Vancomycin  Zosyn  HPI/Subjective: Patient is a 23 year old male with a past medical history of IV drug abuse, alcoholism, who initially presented to the emergency room at [facility] on 10/19/2014 presenting with complaints of right upper extremity pain, erythema, swelling. Imaging studies performed at that facility showed subcutaneous emphysema as it was concern for fasciitis. Patient reported injecting to his right upper extremity.  He was transferred to [facility], taken to the OR where he underwent right forearm and upper arm incision and drainage and fasciotomy of volar forearm and volar upper arm, procedure performed by Dr. Merlyn LotKuzma. He tolerated procedure well. He was continued on empiric IV antibiotic therapy with Zosyn and Vancomycin.   Objective: Filed Vitals:   10/20/14 0456  BP: 140/83  Pulse: 103  Temp: 100.3 F (37.9 C)  Resp: 14    Intake/Output Summary (Last 24 hours) at 10/20/14 0916 Last data filed at 10/20/14 [id number]  Gross per 24 hour  Intake   1100 ml  Output      0 ml  Net   1100 ml   Filed Weights   10/19/14 1450  Weight: 61.236 kg (135 lb)    Exam:   General:  Patient is awake alert oriented  Cardiovascular: Regular rate and rhythm normal S1-S2  Respiratory: Normal respiratory effort, lungs are clear to auscultation bilaterally  Abdomen: Soft nontender nondistended  Musculoskeletal: Right upper extremity in a cast, there is some swelling and erythema over his right hand  Data Reviewed: Basic Metabolic Panel:  Recent Labs Lab 10/19/14 1710 10/20/14 0619  NA 136 131*  K 4.4 3.6  CL 102 100  CO2 23 22  GLUCOSE 122* 131*  BUN 8 5*  CREATININE 0.75 0.80  CALCIUM 9.5 8.3*   Liver Function Tests:  Recent Labs Lab 10/19/14 1710 10/20/14 0619  AST 67* 38*  ALT 97* 67*  ALKPHOS 66 62  BILITOT 0.9 0.3  PROT 7.3 5.6*  ALBUMIN 3.9 2.9*   No results for input(s): LIPASE, AMYLASE in the last 168 hours. No results for input(s): AMMONIA in the last 168 hours. CBC:  Recent Labs Lab 10/19/14 1710 10/20/14 0619  WBC 27.6* 20.5*  NEUTROABS 24.6* 18.1*  HGB 15.1 13.6  HCT 44.1 40.3  MCV 92.5 91.2  PLT 312 250   Cardiac Enzymes:  Recent Labs Lab 10/19/14 1710 10/20/14 0619  CKTOTAL 80 210   BNP (last 3 results) No results for input(s): PROBNP in the last 8760 hours. CBG: No results for input(s): GLUCAP in the last 168 hours.  Recent Results (from the  past 240 hour(s))  Wound culture     Status: None (Preliminary result)   Collection Time: 10/20/14  2:58 AM  Result Value Ref Range Status   Specimen Description WOUND RIGHT FOREARM  Final   Special Requests PATIENT ON FOLLOWING ZOSYN VANCOMYCIN  Final   Gram Stain   Final    ABUNDANT WBC PRESENT,BOTH PMN AND MONONUCLEAR NO SQUAMOUS EPITHELIAL CELLS SEEN ABUNDANT GRAM NEGATIVE RODS RARE GRAM POSITIVE RODS FEW GRAM POSITIVE COCCI IN PAIRS AND CHAINS    Culture PENDING  Incomplete   Report Status PENDING  Incomplete  Gram stain     Status: None   Collection Time: 10/20/14  2:58 AM  Result Value Ref Range Status   Specimen Description WOUND RIGHT FOREARM  Final   Special Requests PATIENT ON FOLLOWING ZOSYN VANCOMYCIN  Final   Gram Stain   Final    ABUNDANT WBC PRESENT,BOTH PMN AND MONONUCLEAR ABUNDANT GRAM NEGATIVE RODS RARE GRAM POSITIVE RODS FEW GRAM POSITIVE COCCI IN PAIR AND CHAINS RARE YEAST    Report Status 10/20/2014 FINAL  Final  Anaerobic culture     Status: None (Preliminary result)   Collection Time: 10/20/14  2:58 AM  Result Value Ref Range Status   Specimen Description WOUND RIGHT FOREARM  Final   Special Requests PATIENT ON FOLLOWING ZOSYN VANCOMYCIN  Final   Gram Stain   Final    ABUNDANT WBC PRESENT,BOTH PMN AND MONONUCLEAR NO SQUAMOUS EPITHELIAL CELLS SEEN ABUNDANT GRAM NEGATIVE RODS RARE GRAM POSITIVE RODS FEW GRAM POSITIVE COCCI IN PAIRS AND CHAINS    Culture PENDING  Incomplete   Report Status PENDING  Incomplete     Studies: Dg Chest 2 View  10/19/2014   CLINICAL DATA:  Wheezing.  Right arm pain and swelling.  EXAM: CHEST  2 VIEW  COMPARISON:  None.  FINDINGS: The heart size and mediastinal contours are within normal limits. Both lungs are clear. No evidence of pleural effusion. Old right clavicle fracture deformity incidentally noted.  IMPRESSION: No active cardiopulmonary disease.   Electronically Signed   By: Myles Rosenthal M.D.   On: 10/19/2014 17:43    Dg Forearm Right  10/19/2014   CLINICAL DATA:  Right arm pain, swelling, and erythema. IV drug use.  EXAM: RIGHT FOREARM - 2 VIEW  COMPARISON:  None.  FINDINGS: Subcutaneous emphysema is seen in the proximal forearm. No evidence of osteolysis or periostitis. No fracture or other bone lesion seen involving the radius or ulna. No radiopaque foreign body identified.  IMPRESSION: Subcutaneous emphysema and proximal forearm. No osseous abnormality visualized.   Electronically Signed   By: Myles Rosenthal M.D.   On: 10/19/2014 17:49   Mr Humerus Right W Wo Contrast  10/20/2014   CLINICAL DATA:  Redness, pain and swelling of the right upper extremity for several days which has worsened 10/19/2014. IV drug abuser.  EXAM: MRI OF THE RIGHT FOREARM WITHOUT AND WITH CONTRAST; MRI OF THE RIGHT HUMERUS WITHOUT AND WITH CONTRAST  TECHNIQUE: Multiplanar, multisequence MR imaging was performed both before and after administration of intravenous contrast.  CONTRAST:  10 mL MULTIHANCE GADOBENATE DIMEGLUMINE 529 MG/ML IV SOLN  COMPARISON:  Plain films of the right arm this same day.  FINDINGS: The study is somewhat limited due to large field-of-view. The patient's arm is in charge the patient's elbow is in 90 degrees of flexion also limiting the examination.  Subcutaneous edema and enhancement are identified consistent with cellulitis. Gas is seen tracking in the anterior soft tissues of the forearm and inferior upper arm. Gas is best visualized tracking between the supinator, extensor carpi radialis longus and pronator teres muscles. A rim enhancing fluid collection with air-fluid levels is identified centered in the pronator teres. Discrete measurement is difficult due to positioning but the fluid collection measures approximately 5.0 cm craniocaudal by up to 2.8 cm transverse by approximately 3.4 cm AP. There is no bone marrow signal abnormality to suggest osteomyelitis.  IMPRESSION: Findings consistent with cellulitis,  pyomyositis and fasciitis centered about the elbow with extending into the forearm. Abscess which appears to lie in the pronator teres is identified as described above. Negative for osteomyelitis.   Electronically Signed   By: Drusilla Kannerhomas  Dalessio M.D.   On: 10/20/2014 08:24   Mr Forearm Right Wo/w Cm  10/20/2014   CLINICAL DATA:  Redness, pain and swelling of the right upper extremity for several days which has worsened 10/19/2014. IV drug abuser.  EXAM: MRI OF THE RIGHT FOREARM WITHOUT AND WITH CONTRAST; MRI OF THE RIGHT HUMERUS WITHOUT AND WITH CONTRAST  TECHNIQUE: Multiplanar, multisequence MR imaging was performed both before and after administration of intravenous contrast.  CONTRAST:  10 mL MULTIHANCE GADOBENATE DIMEGLUMINE 529 MG/ML IV SOLN  COMPARISON:  Plain films of the right arm this same day.  FINDINGS: The study is somewhat limited due to large field-of-view. The patient's arm is in charge the patient's elbow is in 90 degrees of flexion also limiting the examination.  Subcutaneous edema and enhancement are identified consistent with cellulitis. Gas is seen tracking in the anterior soft tissues of the forearm and inferior upper arm. Gas is best visualized tracking between the supinator, extensor carpi radialis longus and pronator teres muscles. A rim enhancing fluid collection with air-fluid levels is identified centered in the pronator teres. Discrete measurement is difficult due to positioning but the fluid collection measures approximately 5.0 cm craniocaudal by up to 2.8 cm transverse by approximately 3.4 cm AP. There is no bone marrow signal abnormality to suggest osteomyelitis.  IMPRESSION: Findings consistent with cellulitis, pyomyositis and fasciitis centered about the elbow with extending into the forearm. Abscess which appears to lie in the pronator teres is identified as described above. Negative for osteomyelitis.   Electronically Signed   By: Drusilla Kannerhomas  Dalessio M.D.   On: 10/20/2014 08:24     Scheduled Meds: . folic acid  1 mg Oral Daily  . LORazepam  0-4 mg Intravenous Q6H   Followed by  . [START ON 10/22/2014] LORazepam  0-4 mg Intravenous Q12H  . multivitamin with minerals  1 tablet Oral Daily  . piperacillin-tazobactam (ZOSYN)  IV  3.375 g Intravenous 3 times per day  . thiamine  100 mg Oral Daily   Or  . thiamine  100 mg Intravenous Daily  . vancomycin  1,000 mg Intravenous Q8H   Continuous Infusions: . sodium chloride 125 mL/hr at 10/20/14 [id number]    Principal Problem:   Sepsis Active Problems:   Necrotizing fasciitis   Polysubstance abuse    Time spent:     Jeralyn BennettZAMORA, Vilma Will  [facility] Pager [phone]. If 7PM-7AM, please contact night-coverage at [email or web address], password [email or web address] 10/20/2014, 9:16  AM  LOS: 1 day

## 2014-10-20 NOTE — ED Notes (Signed)
Patient to SDS room 36

## 2014-10-20 NOTE — ED Notes (Signed)
Patient transferred to [facility] ER at this time. Patient wanted to smoke and it was not allowed. Patient verbalized loudly that he did not think it was right that he could not go outside and smoke. Patient was informed that smoking was not allowed on [facility] and that going to have surgery was more important than smoking since his medical problem was potentially severe enough for him to lose his arm or even his life. Patient [facility] and continued to be loud but was transported at this time.

## 2014-10-20 NOTE — Brief Op Note (Signed)
10/19/2014 - 10/20/2014  4:10 AM  PATIENT:  [name]  23 y.o. male  PRE-OPERATIVE DIAGNOSIS:  necrotizing fasciitis  POST-OPERATIVE DIAGNOSIS:  necrotizing fasciitis, compartment syndrome forearm and upper arm  PROCEDURE:  Procedure(s): INCISION AND DRAINAGE RIGHT ARM, FASCIOTOMY RIGHT FOREARM (Right) and upper arm  SURGEON:  Surgeon(s) and Role:    * Betha LoaKevin Leone Putman, MD - Primary    * Dairl PonderMatthew Weingold, MD - Assisting  PHYSICIAN ASSISTANT:   ASSISTANTS: Dairl PonderMatthew Weingold. MD   ANESTHESIA:   general  EBL:  Total I/O In: 900 [I.V.:900] Out: -   BLOOD ADMINISTERED:none  DRAINS: iodoform packing  LOCAL MEDICATIONS USED:  NONE  SPECIMEN:  Source of Specimen:  right forearm  DISPOSITION OF SPECIMEN:  micro  COUNTS:  YES  TOURNIQUET:   Total Tourniquet Time Documented: Upper Arm (Right) - 65 minutes Total: Upper Arm (Right) - 65 minutes   DICTATION: .Other Dictation: Dictation Number [id number]  PLAN OF CARE: Admit to inpatient , hospitalist service  PATIENT DISPOSITION:  PACU - hemodynamically stable.   Delay start of Pharmacological VTE agent (>24hrs) due to surgical blood loss or risk of bleeding: no

## 2014-10-20 NOTE — Progress Notes (Signed)
MD at bedside. 

## 2014-10-20 NOTE — Op Note (Signed)
[id number] 

## 2014-10-20 NOTE — Anesthesia Preprocedure Evaluation (Addendum)
Anesthesia Evaluation  Patient identified by MRN, date of birth, ID band Patient awake    Reviewed: Allergy & Precautions, H&P , NPO status , Patient's Chart, lab work & pertinent test results  Airway Mallampati: II  TM Distance: >3 FB Neck ROM: full    Dental  (+) Poor Dentition, Dental Advidsory Given   Pulmonary neg pulmonary ROS, asthma , Current Smoker,  breath sounds clear to auscultation        Cardiovascular negative cardio ROS  Rhythm:regular Rate:Normal     Neuro/Psych negative neurological ROS  negative psych ROS   GI/Hepatic negative GI ROS, Neg liver ROS,   Endo/Other  negative endocrine ROS  Renal/GU negative Renal ROS     Musculoskeletal   Abdominal   Peds  Hematology   Anesthesia Other Findings   Reproductive/Obstetrics negative OB ROS                           Anesthesia Physical Anesthesia Plan  ASA: II and emergent  Anesthesia Plan: General   Post-op Pain Management:    Induction: Intravenous, Rapid sequence and Cricoid pressure planned  Airway Management Planned: Oral ETT  Additional Equipment:   Intra-op Plan:   Post-operative Plan: Extubation in OR  Informed Consent:   Plan Discussed with: CRNA, Anesthesiologist and Surgeon  Anesthesia Plan Comments:         Anesthesia Quick Evaluation

## 2014-10-20 NOTE — ED Notes (Signed)
Patient stated NO WALLET or jewelry

## 2014-10-20 NOTE — H&P (Signed)
[facility] History and Physical  Jared SangerRobert Larimer ZOX:[id number] DOB: 06/24/1991 DOA: 10/19/2014  Referring physician: Dr. Merlyn LotKuzma. PCP: No primary care provider on file.  Chief Complaint: Right upper extremity pain and swelling.  HPI: Jared SangerRobert [name] is a 23 y.o. male with history of IV drug abuse, alcoholism presented [facility] the ER at [facility] with complaints of a right upper extremity. Patient had injected some salt solution into his forearm a few days ago and since then they have been having increasing swelling and pain which is gradually worsening. X-rays done showed subcutaneous emphysema concerning for transient fasciitis and patient was taken to the OR. Patient presently is status post debridement. Patient has been started on empiric antibiotics and admitted for further management of necrotizing fasciitis of the right upper extremity. Right upper extremities already under dressing. Denies any chest pain or shortness of breath nausea vomiting abdominal pain or diarrhea.   Review of Systems: As presented in the history of presenting illness, rest negative.  Past Medical History  Diagnosis Date  . Asthma    History reviewed. No pertinent past surgical history. Social History:  reports that he has been smoking Cigarettes.  He has been smoking about 1.00 pack per day. He uses smokeless tobacco. He reports that he drinks alcohol. He reports that he uses illicit drugs (Cocaine, Marijuana, Methamphetamines, and IV). Where does patient live home. Can patient participate in ADLs? Yes.  No Known Allergies  Family History:  Family History  Problem Relation Age of Onset  . Diabetes Mellitus II Neg Hx       Prior to Admission medications   Medication Sig Start Date End Date Taking? Authorizing Provider  ibuprofen (ADVIL,MOTRIN) 200 MG tablet Take 1,000 mg by mouth every 6 (six) hours as needed. For pain   Yes Historical Provider, MD  oxyCODONE-acetaminophen (PERCOCET/ROXICET) 5-325  MG per tablet Take 1 tablet by mouth every 4 (four) hours as needed for pain. Patient not taking: Reported on 10/19/2014 11/29/12   Janne NapoleonHope M Neese, NP    Physical Exam: Filed Vitals:   10/20/14 0415 10/20/14 0430 10/20/14 0445 10/20/14 0456  BP: 150/86 156/86 145/80 140/83  Pulse: 117 116 111 103  Temp:   97.7 F (36.5 C) 100.3 F (37.9 C)  TempSrc:    Oral  Resp: 18 17 13 14   Height:      Weight:      SpO2: 91% 92% 94% 94%     General:  Moderately built and nourished.  Eyes: Anicteric no pallor.  ENT: No discharge eyes nose or mouth.  Neck: No mass felt.  Cardiovascular: S1 and S2 heard.  Respiratory: No rhonchi or crepitations.  Abdomen: Soft nontender bowel sounds present.  Skin: No rash.  Musculoskeletal: Right upper extremity is dressing done status post surgery.  Psychiatric: Appears normal.  Neurologic: Alert and oriented to time place and person. Moves all extremities.  Labs on Admission:  Basic Metabolic Panel:  Recent Labs Lab 10/19/14 1710  NA 136  K 4.4  CL 102  CO2 23  GLUCOSE 122*  BUN 8  CREATININE 0.75  CALCIUM 9.5   Liver Function Tests:  Recent Labs Lab 10/19/14 1710  AST 67*  ALT 97*  ALKPHOS 66  BILITOT 0.9  PROT 7.3  ALBUMIN 3.9   No results for input(s): LIPASE, AMYLASE in the last 168 hours. No results for input(s): AMMONIA in the last 168 hours. CBC:  Recent Labs Lab 10/19/14 1710  WBC 27.6*  NEUTROABS 24.6*  HGB 15.1  HCT 44.1  MCV 92.5  PLT 312   Cardiac Enzymes:  Recent Labs Lab 10/19/14 1710  CKTOTAL 80    BNP (last 3 results) No results for input(s): PROBNP in the last 8760 hours. CBG: No results for input(s): GLUCAP in the last 168 hours.  Radiological Exams on Admission: Dg Chest 2 View  10/19/2014   CLINICAL DATA:  Wheezing.  Right arm pain and swelling.  EXAM: CHEST  2 VIEW  COMPARISON:  None.  FINDINGS: The heart size and mediastinal contours are within normal limits. Both lungs are clear.  No evidence of pleural effusion. Old right clavicle fracture deformity incidentally noted.  IMPRESSION: No active cardiopulmonary disease.   Electronically Signed   By: Myles RosenthalJohn  Stahl M.D.   On: 10/19/2014 17:43   Dg Forearm Right  10/19/2014   CLINICAL DATA:  Right arm pain, swelling, and erythema. IV drug use.  EXAM: RIGHT FOREARM - 2 VIEW  COMPARISON:  None.  FINDINGS: Subcutaneous emphysema is seen in the proximal forearm. No evidence of osteolysis or periostitis. No fracture or other bone lesion seen involving the radius or ulna. No radiopaque foreign body identified.  IMPRESSION: Subcutaneous emphysema and proximal forearm. No osseous abnormality visualized.   Electronically Signed   By: Myles RosenthalJohn  Stahl M.D.   On: 10/19/2014 17:49     Assessment/Plan Principal Problem:   Sepsis Active Problems:   Necrotizing fasciitis   Polysubstance abuse   1. Sepsis secondary to necrotizing fasciitis of the right upper extremity - I have discussed with Dr. Merlyn LotKuzma on call hand surgeon. Patient is status post surgery at this time and patient will be placed on vancomycin and Zosyn. Follow cultures. Continue with hydration. Pain relief medications. 2. Polysubstance abuse including IV drugs alcohol and tobacco - patient has been placed on alcohol withdrawal protocol using IV Ativan and thiamine. Social work consult for substance abuse. 3. Mildly elevated LFTs probably from alcoholism - check acute hepatitis panel and HIV. Positive for LFTs. 4. History of asthma in childhood presently not wheezing.    Code Status: Full code.  Family Communication: None.  Disposition Plan: Admit to inpatient.    Phoua Hoadley N. [facility] Pager [phone].  If 7PM-7AM, please contact night-coverage [email or web address] Password [email or web address] 10/20/2014, 5:56 AM

## 2014-10-20 NOTE — Progress Notes (Signed)
Utilization review completed.  

## 2014-10-21 ENCOUNTER — Inpatient Hospital Stay (HOSPITAL_COMMUNITY): Payer: Self-pay | Admitting: Anesthesiology

## 2014-10-21 ENCOUNTER — Encounter (HOSPITAL_COMMUNITY): Payer: Self-pay | Admitting: Orthopedic Surgery

## 2014-10-21 ENCOUNTER — Encounter (HOSPITAL_COMMUNITY)
Admission: EM | Discharge status: Against Medical Advice | Payer: Self-pay | Source: Home / Self Care | Attending: Internal Medicine

## 2014-10-21 DIAGNOSIS — Z9119 Patient's noncompliance with other medical treatment and regimen: Secondary | ICD-10-CM

## 2014-10-21 HISTORY — PX: I&D EXTREMITY: SHX5045

## 2014-10-21 LAB — BASIC METABOLIC PANEL WITH GFR
Anion gap: 7 (ref 5–15)
BUN: <5 mg/dL — ABNORMAL LOW (ref 6–23)
CO2: 26 mmol/L (ref 19–32)
Calcium: 8.3 mg/dL — ABNORMAL LOW (ref 8.4–10.5)
Chloride: 100 meq/L (ref 96–112)
Creatinine, Ser: 0.7 mg/dL (ref 0.50–1.35)
GFR calc Af Amer: >90 mL/min
GFR calc non Af Amer: >90 mL/min
Glucose, Bld: 130 mg/dL — ABNORMAL HIGH (ref 70–99)
Potassium: 3.5 mmol/L (ref 3.5–5.1)
Sodium: 133 mmol/L — ABNORMAL LOW (ref 135–145)

## 2014-10-21 LAB — CBC
HEMATOCRIT: 39.5 % (ref 39.0–52.0)
Hemoglobin: 13.4 g/dL (ref 13.0–17.0)
MCH: 30.5 pg (ref 26.0–34.0)
MCHC: 33.9 g/dL (ref 30.0–36.0)
MCV: 89.8 fL (ref 78.0–100.0)
Platelets: 228 10*3/uL (ref 150–400)
RBC: 4.4 MIL/uL (ref 4.22–5.81)
RDW: 13 % (ref 11.5–15.5)
WBC: 17.2 10*3/uL — ABNORMAL HIGH (ref 4.0–10.5)

## 2014-10-21 SURGERY — IRRIGATION AND DEBRIDEMENT EXTREMITY
Anesthesia: General | Site: Arm Lower | Laterality: Right

## 2014-10-21 MED ORDER — SODIUM CHLORIDE 0.9 % IR SOLN
Status: DC | PRN
  Administered 2014-10-21: 3000 mL
  Administered 2014-10-21: 1000 mL

## 2014-10-21 MED ORDER — MIDAZOLAM HCL 5 MG/5ML IJ SOLN
INTRAMUSCULAR | Status: DC | PRN
  Administered 2014-10-21: 2 mg via INTRAVENOUS

## 2014-10-21 MED ORDER — MIDAZOLAM HCL 2 MG/2ML IJ SOLN
INTRAMUSCULAR | Status: AC
  Filled 2014-10-21: qty 2

## 2014-10-21 MED ORDER — LIDOCAINE HCL (CARDIAC) 20 MG/ML IV SOLN
INTRAVENOUS | Status: DC | PRN
  Administered 2014-10-21: 100 mg via INTRAVENOUS

## 2014-10-21 MED ORDER — FENTANYL CITRATE 0.05 MG/ML IJ SOLN
INTRAMUSCULAR | Status: AC
  Filled 2014-10-21: qty 5

## 2014-10-21 MED ORDER — PROPOFOL 10 MG/ML IV BOLUS
INTRAVENOUS | Status: DC | PRN
  Administered 2014-10-21: 200 mg via INTRAVENOUS

## 2014-10-21 MED ORDER — FENTANYL CITRATE 0.05 MG/ML IJ SOLN
INTRAMUSCULAR | Status: DC | PRN
  Administered 2014-10-21 (×2): 100 ug via INTRAVENOUS
  Administered 2014-10-21: 50 ug via INTRAVENOUS
  Administered 2014-10-21: 100 ug via INTRAVENOUS

## 2014-10-21 MED ORDER — GLYCOPYRROLATE 0.2 MG/ML IJ SOLN
INTRAMUSCULAR | Status: AC
  Filled 2014-10-21: qty 2

## 2014-10-21 MED ORDER — ONDANSETRON HCL 4 MG/2ML IJ SOLN
INTRAMUSCULAR | Status: AC
  Filled 2014-10-21: qty 2

## 2014-10-21 MED ORDER — LIDOCAINE HCL (CARDIAC) 20 MG/ML IV SOLN
INTRAVENOUS | Status: AC
  Filled 2014-10-21: qty 5

## 2014-10-21 MED ORDER — BUPIVACAINE HCL (PF) 0.25 % IJ SOLN
INTRAMUSCULAR | Status: AC
  Filled 2014-10-21: qty 30

## 2014-10-21 MED ORDER — LACTATED RINGERS IV SOLN
INTRAVENOUS | Status: DC | PRN
  Administered 2014-10-21: 11:00:00 via INTRAVENOUS

## 2014-10-21 MED ORDER — PROPOFOL 10 MG/ML IV BOLUS
INTRAVENOUS | Status: AC
  Filled 2014-10-21: qty 20

## 2014-10-21 MED ORDER — HYDROMORPHONE HCL 1 MG/ML IJ SOLN
0.2500 mg | INTRAMUSCULAR | Status: DC | PRN
  Administered 2014-10-21 (×2): 0.5 mg via INTRAVENOUS

## 2014-10-21 MED ORDER — HYDROMORPHONE HCL 1 MG/ML IJ SOLN
INTRAMUSCULAR | Status: AC
  Filled 2014-10-21: qty 1

## 2014-10-21 MED ORDER — ONDANSETRON HCL 4 MG/2ML IJ SOLN
INTRAMUSCULAR | Status: DC | PRN
  Administered 2014-10-21: 4 mg via INTRAVENOUS

## 2014-10-21 MED ORDER — ONDANSETRON HCL 4 MG/2ML IJ SOLN: 4.0000 mg | Freq: Once | INTRAMUSCULAR | Status: DC | PRN

## 2014-10-21 SURGICAL SUPPLY — 55 items
BANDAGE COBAN STERILE 2 (GAUZE/BANDAGES/DRESSINGS) IMPLANT
BANDAGE ELASTIC 3 VELCRO ST LF (GAUZE/BANDAGES/DRESSINGS) ×3 IMPLANT
BANDAGE ELASTIC 4 VELCRO ST LF (GAUZE/BANDAGES/DRESSINGS) ×3 IMPLANT
BNDG COHESIVE 1X5 TAN STRL LF (GAUZE/BANDAGES/DRESSINGS) IMPLANT
BNDG CONFORM 2 STRL LF (GAUZE/BANDAGES/DRESSINGS) IMPLANT
BNDG ESMARK 4X9 LF (GAUZE/BANDAGES/DRESSINGS) IMPLANT
BNDG GAUZE ELAST 4 BULKY (GAUZE/BANDAGES/DRESSINGS) ×6 IMPLANT
CORDS BIPOLAR (ELECTRODE) ×3 IMPLANT
COVER SURGICAL LIGHT HANDLE (MISCELLANEOUS) ×3 IMPLANT
DECANTER SPIKE VIAL GLASS SM (MISCELLANEOUS) IMPLANT
DRAIN PENROSE 1/4X12 LTX STRL (WOUND CARE) IMPLANT
DRSG ADAPTIC 3X8 NADH LF (GAUZE/BANDAGES/DRESSINGS) ×3 IMPLANT
DRSG EMULSION OIL 3X3 NADH (GAUZE/BANDAGES/DRESSINGS) IMPLANT
DRSG PAD ABDOMINAL 8X10 ST (GAUZE/BANDAGES/DRESSINGS) ×6 IMPLANT
GAUZE IODOFORM PACK 1/2 7832 (GAUZE/BANDAGES/DRESSINGS) ×3 IMPLANT
GAUZE SPONGE 4X4 12PLY STRL (GAUZE/BANDAGES/DRESSINGS) ×6 IMPLANT
GAUZE XEROFORM 1X8 LF (GAUZE/BANDAGES/DRESSINGS) IMPLANT
GLOVE BIO SURGEON STRL SZ7.5 (GLOVE) ×3 IMPLANT
GLOVE BIOGEL PI IND STRL 8 (GLOVE) ×1 IMPLANT
GLOVE BIOGEL PI INDICATOR 8 (GLOVE) ×2
GOWN STRL REIN XL XLG (GOWN DISPOSABLE) ×3 IMPLANT
HANDPIECE INTERPULSE COAX TIP (DISPOSABLE)
KIT BASIN OR (CUSTOM PROCEDURE TRAY) ×3 IMPLANT
KIT ROOM TURNOVER OR (KITS) ×3 IMPLANT
LOOP VESSEL MAXI BLUE (MISCELLANEOUS) IMPLANT
LOOP VESSEL MINI RED (MISCELLANEOUS) IMPLANT
MANIFOLD NEPTUNE II (INSTRUMENTS) IMPLANT
NEEDLE HYPO 25X1 1.5 SAFETY (NEEDLE) IMPLANT
NS IRRIG 1000ML POUR BTL (IV SOLUTION) ×3 IMPLANT
PACK ORTHO EXTREMITY (CUSTOM PROCEDURE TRAY) ×3 IMPLANT
PAD ARMBOARD 7.5X6 YLW CONV (MISCELLANEOUS) ×3 IMPLANT
PAD CAST 3X4 CTTN HI CHSV (CAST SUPPLIES) IMPLANT
PAD CAST 4YDX4 CTTN HI CHSV (CAST SUPPLIES) IMPLANT
PADDING CAST COTTON 3X4 STRL (CAST SUPPLIES)
PADDING CAST COTTON 4X4 STRL (CAST SUPPLIES)
SCRUB BETADINE 4OZ XXX (MISCELLANEOUS) ×3 IMPLANT
SET CYSTO W/LG BORE CLAMP LF (SET/KITS/TRAYS/PACK) ×3 IMPLANT
SET HNDPC FAN SPRY TIP SCT (DISPOSABLE) IMPLANT
SOLUTION BETADINE 4OZ (MISCELLANEOUS) ×3 IMPLANT
SPONGE LAP 18X18 X RAY DECT (DISPOSABLE) ×3 IMPLANT
SPONGE LAP 4X18 X RAY DECT (DISPOSABLE) ×3 IMPLANT
SUCTION FRAZIER TIP 10 FR DISP (SUCTIONS) ×3 IMPLANT
SUT ETHILON 2 0 PSLX (SUTURE) ×6 IMPLANT
SUT ETHILON 4 0 PS 2 18 (SUTURE) IMPLANT
SUT MON AB 5-0 P3 18 (SUTURE) IMPLANT
SYR CONTROL 10ML LL (SYRINGE) IMPLANT
TOWEL OR 17X24 6PK STRL BLUE (TOWEL DISPOSABLE) ×3 IMPLANT
TOWEL OR 17X26 10 PK STRL BLUE (TOWEL DISPOSABLE) ×3 IMPLANT
TUBE ANAEROBIC SPECIMEN COL (MISCELLANEOUS) IMPLANT
TUBE CONNECTING 12'X1/4 (SUCTIONS) ×1
TUBE CONNECTING 12X1/4 (SUCTIONS) ×2 IMPLANT
TUBE FEEDING 5FR 15 INCH (TUBING) IMPLANT
UNDERPAD 30X30 INCONTINENT (UNDERPADS AND DIAPERS) ×3 IMPLANT
WATER STERILE IRR 1000ML POUR (IV SOLUTION) IMPLANT
YANKAUER SUCT BULB TIP NO VENT (SUCTIONS) ×3 IMPLANT

## 2014-10-21 NOTE — Transfer of Care (Signed)
Immediate Anesthesia Transfer of Care Note  Patient: Jared SangerRobert Roosevelt  Procedure(s) Performed: Procedure(s): INCISION  AND DRAINAGE RIGHT ARM   (Right)  Patient Location: PACU  Anesthesia Type:General  Level of Consciousness: awake, alert  and oriented  Airway & Oxygen Therapy: Patient Spontanous Breathing and Patient connected to nasal cannula oxygen  Post-op Assessment: Report given to PACU RN and Post -op Vital signs reviewed and stable  Post vital signs: Reviewed and stable  Complications: No apparent anesthesia complications

## 2014-10-21 NOTE — Progress Notes (Signed)
Patient not interested in pneumonia vaccine

## 2014-10-21 NOTE — Interval H&P Note (Signed)
History and Physical Interval Note:  10/21/2014 11:33 AM  [name]  has presented today for surgery, with the diagnosis of NECROTIZING FASCIITIS RIGHT ARM   The various methods of treatment have been discussed with the patient and family. After consideration of risks, benefits and other options for treatment, the patient has consented to  Procedure(s): INCISION  AND DRAINAGE RIGHT ARM   (Right) as a surgical intervention .  The patient's history has been reviewed, patient examined, no change in status, stable for surgery.  I have reviewed the patient's chart and labs.  Questions were answered to the patient's satisfaction.     Khara Renaud R

## 2014-10-21 NOTE — Progress Notes (Signed)
Unsuccessful attempt at contacting patient to ask him what pharmacy he uses so that an oral antibiotic can be called in for him if ordered by hospitalist. No answer on phone # in chart.

## 2014-10-21 NOTE — Op Note (Signed)
[id number] 

## 2014-10-21 NOTE — Progress Notes (Signed)
[facility] PROGRESS NOTE  Jared SangerRobert [name] BJY:[id number] DOB: 03/01/1991 DOA: 10/19/2014 PCP: No primary care provider on file.  Assessment/Plan: 1. Right forearm necrotizing fasciitis/upper arm necrotizing fasciitis/compartment syndrome -Patient having a history of polysubstance abuse, injecting into his right arm, likely cause of his infection. -He underwent incision and drainage and fasciotomy of right forearm and upper arm on 10/20/2014. -Tolerated procedure well there no immediate complications. -Continue empiric IV antimicrobial therapy with Vancomycin and Zosyn -Grams to stain showing abundant gram-negative rods, few gram-positive cocci -Blood cultures drawn on 10/19/2049 showing no growth 2 sets, wound cultures pending -White count trended down to 17,200 today from 27,600 on 10/19/2014  2.  Sepsis -Present on admission, evidenced by a white count of 20,500, lactic acid of 3.1, heart rate of 111 -Secondary to right forearm necrotizing fasciitis -Status post ORIF and fasciotomy -Follow-up on blood cultures, continue empiric IV antimicrobial therapy with vancomycin and Zosyn  3.  History of alcohol abuse -He did not appear to have clinical signs or symptoms of acute withdrawal during my encounter this morning -Placed on CIWA protocol  4.  Polysubstance abuse -Social work consult  Code Status: Full code Family Communication:  Disposition Plan: Continue IV antimicrobial therapy, at discharge home when stable   Consultants:  Orthopedic surgery  Procedures:  Incision and drainage and fasciotomy of right forearm and upper arm on 10/20/2014.  Antibiotics:  Vancomycin  Zosyn  HPI/Subjective: Patient is a 23 year old male with a past medical history of IV drug abuse, alcoholism, who initially presented to the emergency room at [facility] on 10/19/2014 presenting with complaints of right upper extremity pain, erythema, swelling. Imaging studies performed at  that facility showed subcutaneous emphysema as it was concern for fasciitis. Patient reported injecting to his right upper extremity. [facility] was [facility], taken to the OR where he underwent right forearm and upper arm incision and drainage and fasciotomy of volar forearm and volar upper arm, procedure performed by Dr. Merlyn LotKuzma. He tolerated procedure well. He was continued on empiric IV antibiotic therapy with Zosyn and Vancomycin.   Objective: Filed Vitals:   10/21/14 0430  BP: 126/87  Pulse: 98  Temp: 99.4 F (37.4 C)  Resp: 16    Intake/Output Summary (Last 24 hours) at 10/21/14 0915 Last data filed at 10/21/14 0430  Gross per 24 hour  Intake    970 ml  Output      0 ml  Net    970 ml   Filed Weights   10/19/14 1450  Weight: 61.236 kg (135 lb)    Exam:   General:  Patient is awake alert oriented  Cardiovascular: Regular rate and rhythm normal S1-S2  Respiratory: Normal respiratory effort, lungs are clear to auscultation bilaterally  Abdomen: Soft nontender nondistended  Musculoskeletal: Right upper extremity in a cast, there is some swelling and erythema over his right hand  Data Reviewed: Basic Metabolic Panel:  Recent Labs Lab 10/19/14 1710 10/20/14 0619 10/21/14 0603  NA 136 131* 133*  K 4.4 3.6 3.5  CL 102 100 100  CO2 23 22 26   GLUCOSE 122* 131* 130*  BUN 8 5* <5*  CREATININE 0.75 0.80 0.70  CALCIUM 9.5 8.3* 8.3*   Liver Function Tests:  Recent Labs Lab 10/19/14 1710 10/20/14 0619  AST 67* 38*  ALT 97* 67*  ALKPHOS 66 62  BILITOT 0.9 0.3  PROT 7.3 5.6*  ALBUMIN 3.9 2.9*   No results for input(s): LIPASE, AMYLASE in the last  168 hours. No results for input(s): AMMONIA in the last 168 hours. CBC:  Recent Labs Lab 10/19/14 1710 10/20/14 0619 10/21/14 0603  WBC 27.6* 20.5* 17.2*  NEUTROABS 24.6* 18.1*  --   HGB 15.1 13.6 13.4  HCT 44.1 40.3 39.5  MCV 92.5 91.2 89.8  PLT 312 250 228   Cardiac Enzymes:  Recent  Labs Lab 10/19/14 1710 10/20/14 0619  CKTOTAL 80 210   BNP (last 3 results) No results for input(s): PROBNP in the last 8760 hours. CBG: No results for input(s): GLUCAP in the last 168 hours.  Recent Results (from the past 240 hour(s))  Blood culture (routine x 2)     Status: None (Preliminary result)   Collection Time: 10/19/14  5:10 PM  Result Value Ref Range Status   Specimen Description BLOOD LEFT ANTECUBITAL DRAWN BY RN LS  Final   Special Requests BOTTLES DRAWN AEROBIC ONLY 4CC  Final   Culture NO GROWTH 1 DAY  Final   Report Status PENDING  Incomplete  Blood culture (routine x 2)     Status: None (Preliminary result)   Collection Time: 10/19/14  5:15 PM  Result Value Ref Range Status   Specimen Description BLOOD LEFT HAND  Final   Special Requests BOTTLES DRAWN AEROBIC ONLY 4CC  Final   Culture NO GROWTH 1 DAY  Final   Report Status PENDING  Incomplete  Wound culture     Status: None (Preliminary result)   Collection Time: 10/20/14  2:58 AM  Result Value Ref Range Status   Specimen Description WOUND RIGHT FOREARM  Final   Special Requests PATIENT ON FOLLOWING ZOSYN VANCOMYCIN  Final   Gram Stain   Final    ABUNDANT WBC PRESENT,BOTH PMN AND MONONUCLEAR NO SQUAMOUS EPITHELIAL CELLS SEEN ABUNDANT GRAM NEGATIVE RODS RARE GRAM POSITIVE RODS FEW GRAM POSITIVE COCCI IN PAIRS AND CHAINS    Culture   Final    Culture reincubated for better growth Performed at [facility]    Report Status PENDING  Incomplete  Gram stain     Status: None   Collection Time: 10/20/14  2:58 AM  Result Value Ref Range Status   Specimen Description WOUND RIGHT FOREARM  Final   Special Requests PATIENT ON FOLLOWING ZOSYN VANCOMYCIN  Final   Gram Stain   Final    ABUNDANT WBC PRESENT,BOTH PMN AND MONONUCLEAR ABUNDANT GRAM NEGATIVE RODS RARE GRAM POSITIVE RODS FEW GRAM POSITIVE COCCI IN PAIR AND CHAINS RARE YEAST    Report Status 10/20/2014 FINAL  Final  Anaerobic culture      Status: None (Preliminary result)   Collection Time: 10/20/14  2:58 AM  Result Value Ref Range Status   Specimen Description WOUND RIGHT FOREARM  Final   Special Requests PATIENT ON FOLLOWING ZOSYN VANCOMYCIN  Final   Gram Stain   Final    ABUNDANT WBC PRESENT,BOTH PMN AND MONONUCLEAR NO SQUAMOUS EPITHELIAL CELLS SEEN ABUNDANT GRAM NEGATIVE RODS RARE GRAM POSITIVE RODS FEW GRAM POSITIVE COCCI IN PAIRS AND CHAINS    Culture PENDING  Incomplete   Report Status PENDING  Incomplete     Studies: Dg Chest 2 View  10/19/2014   CLINICAL DATA:  Wheezing.  Right arm pain and swelling.  EXAM: CHEST  2 VIEW  COMPARISON:  None.  FINDINGS: The heart size and mediastinal contours are within normal limits. Both lungs are clear. No evidence of pleural effusion. Old right clavicle fracture deformity incidentally noted.  IMPRESSION: No active cardiopulmonary disease.  Electronically Signed   By: Myles Rosenthal M.D.   On: 10/19/2014 17:43   Dg Forearm Right  10/19/2014   CLINICAL DATA:  Right arm pain, swelling, and erythema. IV drug use.  EXAM: RIGHT FOREARM - 2 VIEW  COMPARISON:  None.  FINDINGS: Subcutaneous emphysema is seen in the proximal forearm. No evidence of osteolysis or periostitis. No fracture or other bone lesion seen involving the radius or ulna. No radiopaque foreign body identified.  IMPRESSION: Subcutaneous emphysema and proximal forearm. No osseous abnormality visualized.   Electronically Signed   By: Myles Rosenthal M.D.   On: 10/19/2014 17:49   Mr Humerus Right W Wo Contrast  10/20/2014   CLINICAL DATA:  Redness, pain and swelling of the right upper extremity for several days which has worsened 10/19/2014. IV drug abuser.  EXAM: MRI OF THE RIGHT FOREARM WITHOUT AND WITH CONTRAST; MRI OF THE RIGHT HUMERUS WITHOUT AND WITH CONTRAST  TECHNIQUE: Multiplanar, multisequence MR imaging was performed both before and after administration of intravenous contrast.  CONTRAST:  10 mL MULTIHANCE GADOBENATE  DIMEGLUMINE 529 MG/ML IV SOLN  COMPARISON:  Plain films of the right arm this same day.  FINDINGS: The study is somewhat limited due to large field-of-view. The patient's arm is in charge the patient's elbow is in 90 degrees of flexion also limiting the examination.  Subcutaneous edema and enhancement are identified consistent with cellulitis. Gas is seen tracking in the anterior soft tissues of the forearm and inferior upper arm. Gas is best visualized tracking between the supinator, extensor carpi radialis longus and pronator teres muscles. A rim enhancing fluid collection with air-fluid levels is identified centered in the pronator teres. Discrete measurement is difficult due to positioning but the fluid collection measures approximately 5.0 cm craniocaudal by up to 2.8 cm transverse by approximately 3.4 cm AP. There is no bone marrow signal abnormality to suggest osteomyelitis.  IMPRESSION: Findings consistent with cellulitis, pyomyositis and fasciitis centered about the elbow with extending into the forearm. Abscess which appears to lie in the pronator teres is identified as described above. Negative for osteomyelitis.   Electronically Signed   By: Drusilla Kanner M.D.   On: 10/20/2014 08:24   Mr Forearm Right Wo/w Cm  10/20/2014   CLINICAL DATA:  Redness, pain and swelling of the right upper extremity for several days which has worsened 10/19/2014. IV drug abuser.  EXAM: MRI OF THE RIGHT FOREARM WITHOUT AND WITH CONTRAST; MRI OF THE RIGHT HUMERUS WITHOUT AND WITH CONTRAST  TECHNIQUE: Multiplanar, multisequence MR imaging was performed both before and after administration of intravenous contrast.  CONTRAST:  10 mL MULTIHANCE GADOBENATE DIMEGLUMINE 529 MG/ML IV SOLN  COMPARISON:  Plain films of the right arm this same day.  FINDINGS: The study is somewhat limited due to large field-of-view. The patient's arm is in charge the patient's elbow is in 90 degrees of flexion also limiting the examination.   Subcutaneous edema and enhancement are identified consistent with cellulitis. Gas is seen tracking in the anterior soft tissues of the forearm and inferior upper arm. Gas is best visualized tracking between the supinator, extensor carpi radialis longus and pronator teres muscles. A rim enhancing fluid collection with air-fluid levels is identified centered in the pronator teres. Discrete measurement is difficult due to positioning but the fluid collection measures approximately 5.0 cm craniocaudal by up to 2.8 cm transverse by approximately 3.4 cm AP. There is no bone marrow signal abnormality to suggest osteomyelitis.  IMPRESSION: Findings consistent with cellulitis,  pyomyositis and fasciitis centered about the elbow with extending into the forearm. Abscess which appears to lie in the pronator teres is identified as described above. Negative for osteomyelitis.   Electronically Signed   By: Drusilla Kanner M.D.   On: 10/20/2014 08:24    Scheduled Meds: . folic acid  1 mg Oral Daily  . LORazepam  0-4 mg Intravenous Q6H   Followed by  . [START ON 10/22/2014] LORazepam  0-4 mg Intravenous Q12H  . multivitamin with minerals  1 tablet Oral Daily  . piperacillin-tazobactam (ZOSYN)  IV  3.375 g Intravenous 3 times per day  . thiamine  100 mg Oral Daily  . vancomycin  1,000 mg Intravenous Q8H   Continuous Infusions:    Principal Problem:   Sepsis Active Problems:   Necrotizing fasciitis   Polysubstance abuse   Cellulitis of arm, right    Time spent: 20 min    Jeralyn Bennett  [facility] Pager [phone]. If 7PM-7AM, please contact night-coverage at [email or web address], password [facility] 10/21/2014, 9:15 AM  LOS: 2 days

## 2014-10-21 NOTE — Progress Notes (Signed)
Concealed razor blade found in patient's room after he was taken down to OR. Placed in biohazard bag with patient sticker on it and placed in upper right cabinet in Pyxis Room B.

## 2014-10-21 NOTE — Progress Notes (Signed)
Patient leaving AMA at this time. Has just returned from the OR. Angry and agitated. AMA form signed by patient. Message left at Dr. Merrilee SeashoreKuzma's office to call this nurse's phone [phone].

## 2014-10-21 NOTE — Progress Notes (Signed)
Report for OR called [place]ynthia [place] [phone].

## 2014-10-21 NOTE — Discharge Summary (Signed)
Physician Discharge Summary  [name]:[id number] DOB: 29-Apr-1991 DOA: 10/19/2014  PCP: No primary care provider on file.  Admit date: 10/19/2014 Discharge date: 10/21/2014  Time spent: 15 minutes  Recommendations for Outpatient Follow-up:  1. Patient left AMA after incision and drainage, I was not able to speak with him or evaluate him at the time he was leaving AMA  Discharge Diagnoses:  Principal Problem:   Sepsis Active Problems:   Necrotizing fasciitis   Polysubstance abuse   Cellulitis of arm, right   Discharge Condition:   Diet recommendation:   Filed Weights   10/19/14 1450  Weight: 61.236 kg (135 lb)    History of present illness:  [name] is a 23 y.o. male with history of IV drug abuse, alcoholism presented to the ER at [facility] with complaints of a right upper extremity. Patient had injected some salt solution into his forearm a few days ago and since then they have been having increasing swelling and pain which is gradually worsening. X-rays done showed subcutaneous emphysema concerning for transient fasciitis and patient was taken to the OR. Patient presently is status post debridement. Patient has been started on empiric antibiotics and admitted for further management of necrotizing fasciitis of the right upper extremity. Right upper extremities already under dressing. Denies any chest pain or shortness of breath nausea vomiting abdominal pain or diarrhea.   Hospital Course:  Patient is a 23 year old male with a past medical history of IV drug abuse, alcoholism, who initially presented to the emergency room at [facility] on 10/19/2014 presenting with complaints of right upper extremity pain, erythema, swelling. Imaging studies performed at that facility showed subcutaneous emphysema as it was concern for fasciitis. Patient reported injecting to his right upper extremity. He was transferred to [facility], taken to the OR where he  underwent right forearm and upper arm incision and drainage and fasciotomy of volar forearm and volar upper arm, procedure performed by Dr. Merlyn Lot. He tolerated procedure well. He was continued on empiric IV antibiotic therapy with Zosyn and Vancomycin. On 10/21/2014 he was again taken to the operating room where he underwent further incision and drainage and debridement of previous wounds involving right forearm. When patient got back from the operating room, patient became angry with nursing staff and left AGAINST MEDICAL ADVICE. Nursing staff spoke with Dr.Weingold of orthopedic surgery. Attempts were made to contact him over telephone to call in prescription. I was unable to evaluate or speak with them at the time he was leaving AGAINST MEDICAL ADVICE. He left without a prescription for antimicrobial therapy.   Discharge Exam: Filed Vitals:   10/21/14 1328  BP:   Pulse:   Temp: 97.2 F (36.2 C)  Resp:     Discharge Instructions    Discharge Medication List as of 10/21/2014  2:04 PM    CONTINUE these medications which have NOT CHANGED   Details  ibuprofen (ADVIL,MOTRIN) 200 MG tablet Take 1,000 mg by mouth every 6 (six) hours as needed. For pain, Until Discontinued, Historical Med    oxyCODONE-acetaminophen (PERCOCET/ROXICET) 5-325 MG per tablet Take 1 tablet by mouth every 4 (four) hours as needed for pain., Starting 11/29/2012, Until Discontinued, Print       No Known Allergies    The results of significant diagnostics from this hospitalization (including imaging, microbiology, ancillary and laboratory) are listed below for reference.    Significant Diagnostic Studies: Dg Chest 2 View  10/19/2014   CLINICAL DATA:  Wheezing.  Right arm pain and swelling.  EXAM: CHEST  2 VIEW  COMPARISON:  None.  FINDINGS: The heart size and mediastinal contours are within normal limits. Both lungs are clear. No evidence of pleural effusion. Old right clavicle fracture deformity incidentally  noted.  IMPRESSION: No active cardiopulmonary disease.   Electronically Signed   By: Myles RosenthalJohn  Stahl M.D.   On: 10/19/2014 17:43   Dg Forearm Right  10/19/2014   CLINICAL DATA:  Right arm pain, swelling, and erythema. IV drug use.  EXAM: RIGHT FOREARM - 2 VIEW  COMPARISON:  None.  FINDINGS: Subcutaneous emphysema is seen in the proximal forearm. No evidence of osteolysis or periostitis. No fracture or other bone lesion seen involving the radius or ulna. No radiopaque foreign body identified.  IMPRESSION: Subcutaneous emphysema and proximal forearm. No osseous abnormality visualized.   Electronically Signed   By: Myles RosenthalJohn  Stahl M.D.   On: 10/19/2014 17:49   Mr Humerus Right W Wo Contrast  10/20/2014   CLINICAL DATA:  Redness, pain and swelling of the right upper extremity for several days which has worsened 10/19/2014. IV drug abuser.  EXAM: MRI OF THE RIGHT FOREARM WITHOUT AND WITH CONTRAST; MRI OF THE RIGHT HUMERUS WITHOUT AND WITH CONTRAST  TECHNIQUE: Multiplanar, multisequence MR imaging was performed both before and after administration of intravenous contrast.  CONTRAST:  10 mL MULTIHANCE GADOBENATE DIMEGLUMINE 529 MG/ML IV SOLN  COMPARISON:  Plain films of the right arm this same day.  FINDINGS: The study is somewhat limited due to large field-of-view. The patient's arm is in charge the patient's elbow is in 90 degrees of flexion also limiting the examination.  Subcutaneous edema and enhancement are identified consistent with cellulitis. Gas is seen tracking in the anterior soft tissues of the forearm and inferior upper arm. Gas is best visualized tracking between the supinator, extensor carpi radialis longus and pronator teres muscles. A rim enhancing fluid collection with air-fluid levels is identified centered in the pronator teres. Discrete measurement is difficult due to positioning but the fluid collection measures approximately 5.0 cm craniocaudal by up to 2.8 cm transverse by approximately 3.4 cm AP.  There is no bone marrow signal abnormality to suggest osteomyelitis.  IMPRESSION: Findings consistent with cellulitis, pyomyositis and fasciitis centered about the elbow with extending into the forearm. Abscess which appears to lie in the pronator teres is identified as described above. Negative for osteomyelitis.   Electronically Signed   By: Drusilla Kannerhomas  Dalessio M.D.   On: 10/20/2014 08:24   Mr Forearm Right Wo/w Cm  10/20/2014   CLINICAL DATA:  Redness, pain and swelling of the right upper extremity for several days which has worsened 10/19/2014. IV drug abuser.  EXAM: MRI OF THE RIGHT FOREARM WITHOUT AND WITH CONTRAST; MRI OF THE RIGHT HUMERUS WITHOUT AND WITH CONTRAST  TECHNIQUE: Multiplanar, multisequence MR imaging was performed both before and after administration of intravenous contrast.  CONTRAST:  10 mL MULTIHANCE GADOBENATE DIMEGLUMINE 529 MG/ML IV SOLN  COMPARISON:  Plain films of the right arm this same day.  FINDINGS: The study is somewhat limited due to large field-of-view. The patient's arm is in charge the patient's elbow is in 90 degrees of flexion also limiting the examination.  Subcutaneous edema and enhancement are identified consistent with cellulitis. Gas is seen tracking in the anterior soft tissues of the forearm and inferior upper arm. Gas is best visualized tracking between the supinator, extensor carpi radialis longus and pronator teres muscles. A rim enhancing fluid collection with air-fluid  levels is identified centered in the pronator teres. Discrete measurement is difficult due to positioning but the fluid collection measures approximately 5.0 cm craniocaudal by up to 2.8 cm transverse by approximately 3.4 cm AP. There is no bone marrow signal abnormality to suggest osteomyelitis.  IMPRESSION: Findings consistent with cellulitis, pyomyositis and fasciitis centered about the elbow with extending into the forearm. Abscess which appears to lie in the pronator teres is identified as  described above. Negative for osteomyelitis.   Electronically Signed   By: Drusilla Kanner M.D.   On: 10/20/2014 08:24    Microbiology: Recent Results (from the past 240 hour(s))  Blood culture (routine x 2)     Status: None (Preliminary result)   Collection Time: 10/19/14  5:10 PM  Result Value Ref Range Status   Specimen Description BLOOD LEFT ANTECUBITAL DRAWN BY RN LS  Final   Special Requests BOTTLES DRAWN AEROBIC ONLY 4CC  Final   Culture NO GROWTH 2 DAYS  Final   Report Status PENDING  Incomplete  Blood culture (routine x 2)     Status: None (Preliminary result)   Collection Time: 10/19/14  5:15 PM  Result Value Ref Range Status   Specimen Description BLOOD LEFT HAND  Final   Special Requests BOTTLES DRAWN AEROBIC ONLY 4CC  Final   Culture NO GROWTH 2 DAYS  Final   Report Status PENDING  Incomplete  Wound culture     Status: None (Preliminary result)   Collection Time: 10/20/14  2:58 AM  Result Value Ref Range Status   Specimen Description WOUND RIGHT FOREARM  Final   Special Requests PATIENT ON FOLLOWING ZOSYN VANCOMYCIN  Final   Gram Stain   Final    ABUNDANT WBC PRESENT,BOTH PMN AND MONONUCLEAR NO SQUAMOUS EPITHELIAL CELLS SEEN ABUNDANT GRAM NEGATIVE RODS RARE GRAM POSITIVE RODS FEW GRAM POSITIVE COCCI IN PAIRS AND CHAINS    Culture   Final    Culture reincubated for better growth Performed at [facility]    Report Status PENDING  Incomplete  Gram stain     Status: None   Collection Time: 10/20/14  2:58 AM  Result Value Ref Range Status   Specimen Description WOUND RIGHT FOREARM  Final   Special Requests PATIENT ON FOLLOWING ZOSYN VANCOMYCIN  Final   Gram Stain   Final    ABUNDANT WBC PRESENT,BOTH PMN AND MONONUCLEAR ABUNDANT GRAM NEGATIVE RODS RARE GRAM POSITIVE RODS FEW GRAM POSITIVE COCCI IN PAIR AND CHAINS RARE YEAST    Report Status 10/20/2014 FINAL  Final  Anaerobic culture     Status: None (Preliminary result)   Collection Time: 10/20/14   2:58 AM  Result Value Ref Range Status   Specimen Description WOUND RIGHT FOREARM  Final   Special Requests PATIENT ON FOLLOWING ZOSYN VANCOMYCIN  Final   Gram Stain   Final    ABUNDANT WBC PRESENT,BOTH PMN AND MONONUCLEAR NO SQUAMOUS EPITHELIAL CELLS SEEN ABUNDANT GRAM NEGATIVE RODS RARE GRAM POSITIVE RODS FEW GRAM POSITIVE COCCI IN PAIRS AND CHAINS    Culture   Final    NO ANAEROBES ISOLATED; CULTURE IN PROGRESS FOR 5 DAYS Performed at [facility]    Report Status PENDING  Incomplete     Labs: Basic Metabolic Panel:  Recent Labs Lab 10/19/14 1710 10/20/14 0619 10/21/14 0603  NA 136 131* 133*  K 4.4 3.6 3.5  CL 102 100 100  CO2 23 22 26   GLUCOSE 122* 131* 130*  BUN 8 5* <5*  CREATININE 0.75 0.80 0.70  CALCIUM 9.5 8.3* 8.3*   Liver Function Tests:  Recent Labs Lab 10/19/14 1710 10/20/14 0619  AST 67* 38*  ALT 97* 67*  ALKPHOS 66 62  BILITOT 0.9 0.3  PROT 7.3 5.6*  ALBUMIN 3.9 2.9*   No results for input(s): LIPASE, AMYLASE in the last 168 hours. No results for input(s): AMMONIA in the last 168 hours. CBC:  Recent Labs Lab 10/19/14 1710 10/20/14 0619 10/21/14 0603  WBC 27.6* 20.5* 17.2*  NEUTROABS 24.6* 18.1*  --   HGB 15.1 13.6 13.4  HCT 44.1 40.3 39.5  MCV 92.5 91.2 89.8  PLT 312 250 228   Cardiac Enzymes:  Recent Labs Lab 10/19/14 1710 10/20/14 0619  CKTOTAL 80 210   BNP: BNP (last 3 results) No results for input(s): PROBNP in the last 8760 hours. CBG: No results for input(s): GLUCAP in the last 168 hours.     SignedJeralyn Bennett:  Zarrah Loveland  [facility] 10/21/2014, 3:33 PM

## 2014-10-21 NOTE — Anesthesia Preprocedure Evaluation (Signed)
Anesthesia Evaluation  Patient identified by MRN, date of birth, ID band Patient awake    Reviewed: Allergy & Precautions, H&P , NPO status , Patient's Chart, lab work & pertinent test results  Airway        Dental   Pulmonary asthma , Current Smoker,          Cardiovascular     Neuro/Psych    GI/Hepatic (+)     substance abuse  alcohol use, marijuana use and IV drug use,   Endo/Other    Renal/GU      Musculoskeletal   Abdominal   Peds  Hematology   Anesthesia Other Findings   Reproductive/Obstetrics                             Anesthesia Physical Anesthesia Plan  ASA: III  Anesthesia Plan: General   Post-op Pain Management:    Induction: Intravenous  Airway Management Planned: Oral ETT and LMA  Additional Equipment:   Intra-op Plan:   Post-operative Plan: Extubation in OR  Informed Consent: I have reviewed the patients History and Physical, chart, labs and discussed the procedure including the risks, benefits and alternatives for the proposed anesthesia with the patient or authorized representative who has indicated his/her understanding and acceptance.     Plan Discussed with: CRNA, Anesthesiologist and Surgeon  Anesthesia Plan Comments:         Anesthesia Quick Evaluation

## 2014-10-21 NOTE — Op Note (Signed)
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  PROCEDURE:  Incision and drainage, right forearm and upper arm with irrigation and debridement of previous wounds.  ASSISTANT:  Artist PaisMatthew A. Mina MarbleWeingold, M.D.  ANESTHESIA:  General.  IV FLUIDS:  Per anesthesia flow sheet.  ESTIMATED BLOOD LOSS:  Minimal.  COMPLICATIONS:  None.  SPECIMENS:  None.  TOURNIQUET TIME:  Approximately 30 minutes.  DISPOSITION:  Stable to PACU.  INDICATIONS:  [name] is a 23 year old male, who was taken to the OR approximately 36 hours ago for incision and drainage of right forearm and upper arm necrotizing fasciitis due to infection from IV drug use. He was returned today for repeat irrigation and debridement as necessary.  Risks, benefits, alternatives of the surgery were discussed and he wished to proceed.  Surgical consent was signed.  OPERATIVE COURSE:  After being identified preoperatively by myself, the patient and I agreed upon procedure and site procedure.  Surgical site was marked.  The risks, benefits, and alternatives of surgery were reviewed and he wished to proceed.  Surgical consent had been signed. He was transferred to the operating room and placed on the operating room table in supine position with the right upper extremity on arm board.  General anesthesia was induced by the anesthesiologist.  The right upper extremity was prepped and draped in normal sterile orthopedic fashion.  Surgical pause was performed between surgeons, anesthesia, and operating staff and  all were in agreement as to the patient, procedure, site procedure.  All packing was removed and the 2 sutures that were in, were removed as well.  There was some purplish coloration to the corner of the flap.  The packing was removed.  There was a very small reaccumulation of cloudy fluid in the forearm.  An additional pocket of purulence was found in the upper arm, radial to the biceps and brachialis muscles.  The remaining musculature looked very good and there was no reaccumulation of fluid.  The wounds and abscess cavities were copiously irrigated with 3000 mL of sterile saline by cysto tubing.  The flap was tacked back across the forearm using 2-0 nylon suture in a tension reliever fashion.  The skin was not closed tightly.  Vascular structures were covered.  An incision was made at the dorsum of the forearm, over the area where the abscess cavity was in the forearm.  This was carried into subcutaneous tissues by spreading technique.  Bovie electrocautery was used to obtain hemostasis.  An interval was made between the 2 muscle bellies down to the abscess cavity.  The cavity was packed from this side.  It was felt this would allow easier wound care.  The abscess cavity in the upper arm was packed from  the volar wound.  The wounds were then dressed with sterile Xeroform, 4 x 4s, and ABDs and wrapped with Kerlix.  A posterior splint was placed and wrapped with Kerlix and Ace bandage.  The tourniquet was deflated approximately 30 minutes.  Fingertips were pink with brisk capillary refill after deflation of tourniquet.  The operative drapes were broken down.  The patient was awoken from anesthesia safely.  He was transferred back to stretcher and taken to PACU in stable condition. He will be continued on IV antibiotics.  We will start hydrotherapy in approximately 2 days with packing change.     Jared Jared Linden Tagliaferro, MD     KK/MEDQ  D:  10/21/2014  T:  10/21/2014  Job:  [id number]

## 2014-10-21 NOTE — Brief Op Note (Signed)
10/19/2014 - 10/21/2014  12:42 PM  PATIENT:  [name]  23 y.o. male  PRE-OPERATIVE DIAGNOSIS:  NECROTIZING FASCIITIS RIGHT ARM   POST-OPERATIVE DIAGNOSIS:  NECROTIZING FASCIITIS RIGHT ARM   PROCEDURE:  Procedure(s): INCISION  AND DRAINAGE RIGHT ARM   (Right)  SURGEON:  Surgeon(s) and Role:    * Betha LoaKevin Akio Hudnall, MD - Primary    * Dairl PonderMatthew Weingold, MD - Assisting  PHYSICIAN ASSISTANT:   ASSISTANTS: Dairl PonderMatthew Weingold, MD   ANESTHESIA:   general  EBL:     BLOOD ADMINISTERED:none  DRAINS: iodoform packing  LOCAL MEDICATIONS USED:  NONE  SPECIMEN:  No Specimen  DISPOSITION OF SPECIMEN:  N/A  COUNTS:  YES  TOURNIQUET:  * Missing tourniquet times found for documented tourniquets in log:  [id number] *  DICTATION: .Other Dictation: Dictation Number [id number]  PLAN OF CARE: Discharge to home after PACU  PATIENT DISPOSITION:  PACU - hemodynamically stable.   Delay start of Pharmacological VTE agent (>24hrs) due to surgical blood loss or risk of bleeding: no

## 2014-10-21 NOTE — Progress Notes (Signed)
Received call from Dr. Mina MarbleWeingold, on call for Dr. Merlyn LotKuzma. Informed him that patient is leaving AMA.

## 2014-10-21 NOTE — Anesthesia Postprocedure Evaluation (Signed)
  Anesthesia Post-op Note  Patient: [name]  Procedure(s) Performed: Procedure(s): INCISION  AND DRAINAGE RIGHT ARM   (Right)  Patient Location: PACU  Anesthesia Type:General  Level of Consciousness: awake, alert , oriented and patient cooperative  Airway and Oxygen Therapy: Patient Spontanous Breathing  Post-op Pain: mild  Post-op Assessment: Post-op Vital signs reviewed, Patient's Cardiovascular Status Stable, Respiratory Function Stable, Patent Airway, No signs of Nausea or vomiting and Pain level controlled  Post-op Vital Signs: stable  Last Vitals:  Filed Vitals:   10/21/14 1328  BP:   Pulse:   Temp: 36.2 C  Resp:     Complications: No apparent anesthesia complications

## 2014-10-21 NOTE — Anesthesia Procedure Notes (Signed)
Procedure Name: LMA Insertion Date/Time: 10/21/2014 11:39 AM Performed by: Yvonne KendallBECKNER, Karis Rilling S Patient Re-evaluated:Patient Re-evaluated prior to inductionOxygen Delivery Method: Circle system utilized Preoxygenation: Pre-oxygenation with 100% oxygen Intubation Type: IV induction LMA Size: 4.0 Number of attempts: 1 Tube secured with: Tape Dental Injury: Teeth and Oropharynx as per pre-operative assessment

## 2014-10-22 ENCOUNTER — Inpatient Hospital Stay (HOSPITAL_COMMUNITY)
Admission: AD | Admit: 2014-10-22 | Discharge: 2014-10-26 | DRG: 558 | Disposition: A | Discharge status: Home / Self Care | Payer: No Typology Code available for payment source | Source: Other Acute Inpatient Hospital | Attending: Internal Medicine | Admitting: Internal Medicine

## 2014-10-22 DIAGNOSIS — F1010 Alcohol abuse, uncomplicated: Secondary | ICD-10-CM | POA: Diagnosis present

## 2014-10-22 DIAGNOSIS — F17210 Nicotine dependence, cigarettes, uncomplicated: Secondary | ICD-10-CM | POA: Diagnosis present

## 2014-10-22 DIAGNOSIS — F1190 Opioid use, unspecified, uncomplicated: Secondary | ICD-10-CM | POA: Diagnosis present

## 2014-10-22 DIAGNOSIS — J45909 Unspecified asthma, uncomplicated: Secondary | ICD-10-CM | POA: Diagnosis present

## 2014-10-22 DIAGNOSIS — F1410 Cocaine abuse, uncomplicated: Secondary | ICD-10-CM | POA: Diagnosis present

## 2014-10-22 DIAGNOSIS — M79601 Pain in right arm: Secondary | ICD-10-CM | POA: Diagnosis present

## 2014-10-22 DIAGNOSIS — E876 Hypokalemia: Secondary | ICD-10-CM | POA: Diagnosis present

## 2014-10-22 DIAGNOSIS — F1910 Other psychoactive substance abuse, uncomplicated: Secondary | ICD-10-CM

## 2014-10-22 DIAGNOSIS — R7989 Other specified abnormal findings of blood chemistry: Secondary | ICD-10-CM

## 2014-10-22 DIAGNOSIS — Y909 Presence of alcohol in blood, level not specified: Secondary | ICD-10-CM | POA: Diagnosis present

## 2014-10-22 DIAGNOSIS — M726 Necrotizing fasciitis: Secondary | ICD-10-CM | POA: Diagnosis present

## 2014-10-22 DIAGNOSIS — T79A0XA Compartment syndrome, unspecified, initial encounter: Secondary | ICD-10-CM | POA: Diagnosis present

## 2014-10-22 MED ORDER — FOLIC ACID 1 MG PO TABS
1.0000 mg | ORAL_TABLET | Freq: Every day | ORAL | Status: DC
  Administered 2014-10-22 – 2014-10-25 (×4): 1 mg via ORAL
  Filled 2014-10-22 (×5): qty 1

## 2014-10-22 MED ORDER — IBUPROFEN 600 MG PO TABS
600.0000 mg | ORAL_TABLET | Freq: Four times a day (QID) | ORAL | Status: AC | PRN
  Administered 2014-10-22 – 2014-10-23 (×2): 600 mg via ORAL
  Filled 2014-10-22 (×3): qty 1

## 2014-10-22 MED ORDER — SODIUM CHLORIDE 0.9 % IV SOLN
INTRAVENOUS | Status: DC
  Administered 2014-10-22: 17:00:00 via INTRAVENOUS

## 2014-10-22 MED ORDER — LORAZEPAM 2 MG/ML IJ SOLN
1.0000 mg | Freq: Four times a day (QID) | INTRAMUSCULAR | Status: AC | PRN
  Administered 2014-10-23: 1 mg via INTRAVENOUS
  Filled 2014-10-22: qty 1

## 2014-10-22 MED ORDER — ADULT MULTIVITAMIN W/MINERALS CH
1.0000 | ORAL_TABLET | Freq: Every day | ORAL | Status: DC
  Administered 2014-10-22 – 2014-10-25 (×4): 1 via ORAL
  Filled 2014-10-22 (×5): qty 1

## 2014-10-22 MED ORDER — ACETAMINOPHEN 325 MG PO TABS
650.0000 mg | ORAL_TABLET | Freq: Four times a day (QID) | ORAL | Status: DC | PRN
  Administered 2014-10-23: 650 mg via ORAL
  Filled 2014-10-22: qty 2

## 2014-10-22 MED ORDER — POTASSIUM CHLORIDE CRYS ER 20 MEQ PO TBCR
40.0000 meq | EXTENDED_RELEASE_TABLET | Freq: Once | ORAL | Status: AC
  Administered 2014-10-22: 40 meq via ORAL
  Filled 2014-10-22: qty 2

## 2014-10-22 MED ORDER — VANCOMYCIN HCL IN DEXTROSE 1-5 GM/200ML-% IV SOLN
1000.0000 mg | Freq: Three times a day (TID) | INTRAVENOUS | Status: DC
  Administered 2014-10-22 – 2014-10-24 (×5): 1000 mg via INTRAVENOUS
  Filled 2014-10-22 (×7): qty 200

## 2014-10-22 MED ORDER — ONDANSETRON HCL 4 MG PO TABS: 4.0000 mg | ORAL_TABLET | Freq: Four times a day (QID) | ORAL | Status: DC | PRN

## 2014-10-22 MED ORDER — ACETAMINOPHEN 650 MG RE SUPP: 650.0000 mg | Freq: Four times a day (QID) | RECTAL | Status: DC | PRN

## 2014-10-22 MED ORDER — VITAMIN B-1 100 MG PO TABS
100.0000 mg | ORAL_TABLET | Freq: Every day | ORAL | Status: DC
  Administered 2014-10-23 – 2014-10-25 (×3): 100 mg via ORAL
  Filled 2014-10-22 (×5): qty 1

## 2014-10-22 MED ORDER — ALBUTEROL SULFATE (2.5 MG/3ML) 0.083% IN NEBU: 2.5000 mg | INHALATION_SOLUTION | RESPIRATORY_TRACT | Status: DC | PRN

## 2014-10-22 MED ORDER — THIAMINE HCL 100 MG/ML IJ SOLN
100.0000 mg | Freq: Every day | INTRAMUSCULAR | Status: DC
  Administered 2014-10-22: 100 mg via INTRAVENOUS
  Filled 2014-10-22 (×5): qty 1

## 2014-10-22 MED ORDER — LORAZEPAM 1 MG PO TABS
1.0000 mg | ORAL_TABLET | Freq: Four times a day (QID) | ORAL | Status: AC | PRN
  Administered 2014-10-24 – 2014-10-25 (×4): 1 mg via ORAL
  Filled 2014-10-22 (×4): qty 1

## 2014-10-22 MED ORDER — ONDANSETRON HCL 4 MG/2ML IJ SOLN: 4.0000 mg | Freq: Four times a day (QID) | INTRAMUSCULAR | Status: DC | PRN

## 2014-10-22 MED ORDER — PIPERACILLIN-TAZOBACTAM 3.375 G IVPB
3.3750 g | Freq: Three times a day (TID) | INTRAVENOUS | Status: DC
  Administered 2014-10-22 – 2014-10-26 (×11): 3.375 g via INTRAVENOUS
  Filled 2014-10-22 (×13): qty 50

## 2014-10-22 MED ORDER — ENOXAPARIN SODIUM 40 MG/0.4ML ~~LOC~~ SOLN
40.0000 mg | SUBCUTANEOUS | Status: DC
  Administered 2014-10-22 – 2014-10-25 (×4): 40 mg via SUBCUTANEOUS
  Filled 2014-10-22 (×5): qty 0.4

## 2014-10-22 MED ORDER — HYDROMORPHONE HCL 1 MG/ML IJ SOLN
1.0000 mg | INTRAMUSCULAR | Status: DC | PRN
  Administered 2014-10-22 – 2014-10-23 (×5): 1 mg via INTRAVENOUS
  Filled 2014-10-22 (×5): qty 1

## 2014-10-22 NOTE — H&P (Signed)
History and Physical  Jared SangerRobert Middendorf FAO:[id number] DOB: 08/11/1991 DOA: 10/22/2014  Referring [facility]. PCP: No primary care provider on file.  Outpatient Specialists:  1. None.  Chief Complaint: Right upper extremity pain and drainage.  HPI: [name] is a 23 y.o. male with history of asthma, polysubstance abuse-tobacco, alcohol, IVDA (cocaine & heroin), crack cocaine, THC and amphetamines, was hospitalized at [facility] on 10/[facility]/14 for right upper extremity necrotizing fasciitis attributed to IV drug abuse. He underwent incision and drainage in the OR by Dr. Betha LoaKevin Kuzma on 10/21/14. Shortly thereafter patient left the hospital AGAINST MEDICAL ADVICE. He presented [facility] room today with complaints of severe right upper extremity pain and some drainage from the surgical wounds and was [facility] for further management. Patient states that he has 9-10/10 constant pain in his right upper extremity, throbbing in nature, made worse by movement or hanging the limb down, associated with bloodstained yellowish clear drainage which had soaked his dressings that were changed in the ED. He denies fevers or chills. He is able to move his fingers well.   Review of Systems: All systems reviewed and apart from history of presenting illness, are negative.  Past Medical History  Diagnosis Date  . Asthma    Past Surgical History  Procedure Laterality Date  . I&d extremity Right 10/20/2014    Procedure: INCISION AND DRAINAGE RIGHT ARM, FASCIOTOMY RIGHT FOREARM;  Surgeon: Betha LoaKevin Kuzma, MD;  Location: [facility] OR;  Service: Orthopedics;  Laterality: Right;   Social History:  reports that he has been smoking Cigarettes.  He has been smoking about 1.00 pack per day. He uses smokeless tobacco. He reports that he drinks alcohol. He reports that he uses illicit drugs (Cocaine, Marijuana, Methamphetamines, and IV). Has a fianc. Independent  of activities of daily living. Smokes a pack of cigarettes per day. Drinks a 12 pack beer daily and the last time was last night. He drinks liquor occasionally in the last time was 2 months ago.  No Known Allergies  Family History  Problem Relation Age of Onset  . Diabetes Mellitus II Neg Hx     Prior to Admission medications   Medication Sig Start Date End Date Taking? Authorizing Provider  ibuprofen (ADVIL,MOTRIN) 200 MG tablet Take 1,000 mg by mouth every 6 (six) hours as needed. For pain    Historical Provider, MD  oxyCODONE-acetaminophen (PERCOCET/ROXICET) 5-325 MG per tablet Take 1 tablet by mouth every 4 (four) hours as needed for pain. Patient not taking: Reported on 10/19/2014 11/29/12   Janne NapoleonHope M Neese, NP   Physical Exam: Ceasar MonsFiled Vitals:   10/22/14 1505  BP: 135/83  Pulse: 82  Temp: 99.1 F (37.3 C)   respiratory rate: 18/m   General exam: Moderately built and nourished pleasant young male patient, sitting up comfortably in bed.  Head, eyes and ENT: Nontraumatic and normocephalic. Pupils equally reacting to light and accommodation. Oral mucosa moist. Bearded.  Neck: Supple. No JVD, carotid bruit or thyromegaly.  Lymphatics: No lymphadenopathy.  Respiratory system: Clear to auscultation. No increased work of breathing.  Cardiovascular system: S1 and S2 heard, RRR. No JVD, murmurs, gallops, clicks or pedal edema.  Gastrointestinal system: Abdomen is nondistended, soft and nontender. Normal bowel sounds heard. No organomegaly or masses appreciated.  Central nervous system: Alert and oriented. No focal neurological deficits.  Extremities: Symmetric 5 x 5 power. Peripheral pulses symmetrically felt. Diffuse swelling of right upper extremity from mid  upper arm to mid fingers. Patient has 3 large surgical I&D wounds-just above the cubital foci, dorsal and ventral aspect of forearm-all of these appear clean with minimal bloodstained serosanguineous drainage soaking the dressing  on top, no purulent drainage, no crepitus or fluctuation. Able to feel peripheral pulses well. Able to move fingers well. No features suggestive of compartment syndrome.  Skin: No rashes or acute findings. Multiple tattoos all over his body.  Musculoskeletal system: Negative exam.  Psychiatry: Pleasant and cooperative.   Labs on Admission:  [facility]  CBC: Hemoglobin 13.5, hematocrit 40, platelets 271 and WBC 13.7.  CMP: Sodium 135, potassium 3.4, chloride 98, bicarbonate 28, BUN 5, creatinine 0.52, albumin 2.9, alkaline phosphatase 69, ALT 55, AST 50, calcium 8.4, glucose 129, total bilirubin 0.6 and total protein 6.4  Basic Metabolic Panel:  Recent Labs Lab 10/19/14 1710 10/20/14 0619 10/21/14 0603  NA 136 131* 133*  K 4.4 3.6 3.5  CL 102 100 100  CO2 23 22 26   GLUCOSE 122* 131* 130*  BUN 8 5* <5*  CREATININE 0.75 0.80 0.70  CALCIUM 9.5 8.3* 8.3*   Liver Function Tests:  Recent Labs Lab 10/19/14 1710 10/20/14 0619  AST 67* 38*  ALT 97* 67*  ALKPHOS 66 62  BILITOT 0.9 0.3  PROT 7.3 5.6*  ALBUMIN 3.9 2.9*   No results for input(s): LIPASE, AMYLASE in the last 168 hours. No results for input(s): AMMONIA in the last 168 hours. CBC:  Recent Labs Lab 10/19/14 1710 10/20/14 0619 10/21/14 0603  WBC 27.6* 20.5* 17.2*  NEUTROABS 24.6* 18.1*  --   HGB 15.1 13.6 13.4  HCT 44.1 40.3 39.5  MCV 92.5 91.2 89.8  PLT 312 250 228   Cardiac Enzymes:  Recent Labs Lab 10/19/14 1710 10/20/14 0619  CKTOTAL 80 210    BNP (last 3 results) No results for input(s): PROBNP in the last 8760 hours. CBG: No results for input(s): GLUCAP in the last 168 hours.  Radiological Exams on Admission: No results found.    Assessment/Plan Principal Problem:   Necrotizing fasciitis Active Problems:   Polysubstance abuse- tobacco, alcohol, IVDA (Heroin/Cocaine), crack cocaine, THC   1. 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 S/P incision and drainage by Dr. Betha LoaKevin Kuzma on 10/21/14. Patient had signed out AMA and now returned secondary to severe pain. Admitted to medical floor. Resume IV vancomycin and Zosyn. Discussed with Dr. Dairl PonderMatthew Weingold, orthopedic/hand surgeon on call who recommended starting hydrotherapy by PT from tomorrow. Elevate right upper extremity. Pain management. Wound cultures: Negative for anaerobes and Gram stain showed abundant gram-negative rods, rare gram-positive rods, few gram-positive cocci in pairs and chains and rare Yeasts. HIV antibody nonreactive. 2. Polysubstance abuse: Tobacco, alcohol, IVDA, crack cocaine, amphetamines and THC. Cessation counseled. Patient declines nicotine patch. Ativan protocol. 3. Hypokalemia: Replace and follow. 4. Mildly elevated LFTs:? Secondary to alcohol abuse. Acute hepatitis panel 10/20/14 was negative. Follow LFTs periodically.     Code Status: Full  Family Communication: None at bedside  Disposition Plan: Home when medically stable   Time spent: 60 minutes  Shalanda Brogden, MD, FACP, FHM. [facility] Pager [phone]  If 7PM-7AM, please contact night-coverage [email or web address] Password [email or web address] 10/22/2014, 4:04 PM

## 2014-10-22 NOTE — Progress Notes (Signed)
ANTIBIOTIC CONSULT NOTE - INITIAL  Pharmacy Consult for vancomycin and Zosyn Indication: Right forearm necrotizing fasciitis  No Known Allergies  Patient Measurements: 61.2 kg 64"  Vital Signs: Temp: 99.1 F (37.3 C) (12/25 1505) BP: 135/83 mmHg (12/25 1505) Pulse Rate: 82 (12/25 1505) Intake/Output from previous day:   Intake/Output from this shift:    Labs:  Recent Labs  10/19/14 1710 10/20/14 0619 10/21/14 0603  WBC 27.6* 20.5* 17.2*  HGB 15.1 13.6 13.4  PLT 312 250 228  CREATININE 0.75 0.80 0.70   Estimated Creatinine Clearance: 121.3 mL/min (by C-G formula based on Cr of 0.7). No results for input(s): VANCOTROUGH, VANCOPEAK, VANCORANDOM, GENTTROUGH, GENTPEAK, GENTRANDOM, TOBRATROUGH, TOBRAPEAK, TOBRARND, AMIKACINPEAK, AMIKACINTROU, AMIKACIN in the last 72 hours.   Microbiology: Recent Results (from the past 720 hour(s))  Blood culture (routine x 2)     Status: None (Preliminary result)   Collection Time: 10/19/14  5:10 PM  Result Value Ref Range Status   Specimen Description BLOOD LEFT ANTECUBITAL DRAWN BY RN LS  Final   Special Requests BOTTLES DRAWN AEROBIC ONLY 4CC  Final   Culture NO GROWTH 2 DAYS  Final   Report Status PENDING  Incomplete  Blood culture (routine x 2)     Status: None (Preliminary result)   Collection Time: 10/19/14  5:15 PM  Result Value Ref Range Status   Specimen Description BLOOD LEFT HAND  Final   Special Requests BOTTLES DRAWN AEROBIC ONLY 4CC  Final   Culture NO GROWTH 2 DAYS  Final   Report Status PENDING  Incomplete  Wound culture     Status: None (Preliminary result)   Collection Time: 10/20/14  2:58 AM  Result Value Ref Range Status   Specimen Description WOUND RIGHT FOREARM  Final   Special Requests PATIENT ON FOLLOWING ZOSYN VANCOMYCIN  Final   Gram Stain   Final    ABUNDANT WBC PRESENT,BOTH PMN AND MONONUCLEAR NO SQUAMOUS EPITHELIAL CELLS SEEN ABUNDANT GRAM NEGATIVE RODS RARE GRAM POSITIVE RODS FEW GRAM POSITIVE  COCCI IN PAIRS AND CHAINS    Culture   Final    Culture reincubated for better growth Performed at [facility]    Report Status PENDING  Incomplete  Gram stain     Status: None   Collection Time: 10/20/14  2:58 AM  Result Value Ref Range Status   Specimen Description WOUND RIGHT FOREARM  Final   Special Requests PATIENT ON FOLLOWING ZOSYN VANCOMYCIN  Final   Gram Stain   Final    ABUNDANT WBC PRESENT,BOTH PMN AND MONONUCLEAR ABUNDANT GRAM NEGATIVE RODS RARE GRAM POSITIVE RODS FEW GRAM POSITIVE COCCI IN PAIR AND CHAINS RARE YEAST    Report Status 10/20/2014 FINAL  Final  Anaerobic culture     Status: None (Preliminary result)   Collection Time: 10/20/14  2:58 AM  Result Value Ref Range Status   Specimen Description WOUND RIGHT FOREARM  Final   Special Requests PATIENT ON FOLLOWING ZOSYN VANCOMYCIN  Final   Gram Stain   Final    ABUNDANT WBC PRESENT,BOTH PMN AND MONONUCLEAR NO SQUAMOUS EPITHELIAL CELLS SEEN ABUNDANT GRAM NEGATIVE RODS RARE GRAM POSITIVE RODS FEW GRAM POSITIVE COCCI IN PAIRS AND CHAINS    Culture   Final    NO ANAEROBES ISOLATED; CULTURE IN PROGRESS FOR 5 DAYS Performed at [facility]    Report Status PENDING  Incomplete    Medical History: Past Medical History  Diagnosis Date  . Asthma     Medications:  Prescriptions prior to admission  Medication Sig Dispense Refill Last Dose  . ibuprofen (ADVIL,MOTRIN) 200 MG tablet Take 1,000 mg by mouth every 6 (six) hours as needed. For pain   10/19/2014 at Unknown time  . oxyCODONE-acetaminophen (PERCOCET/ROXICET) 5-325 MG per tablet Take 1 tablet by mouth every 4 (four) hours as needed for pain. (Patient not taking: Reported on 10/19/2014) 20 tablet 0    Assessment: 23 y/o male with hx of IVDA and RUE necrotizing fasciitis s/p I&D on 12/24 who left AMA yesterday but [facility] ED with RUE pain and drainage from surgical site. [facility]. He  received vancomycin 1000 mg IV at 11:04 at [facility]. Renal function is normal. Tmax is 99.1 and WBC are elevated.  Goal of Therapy:  Vancomycin trough level 15-20 mcg/ml Eradication of infection  Plan:  - Vancomycin 1000 mg IV q8h - next dose 18:00 - Zosyn 3.375 g IV q8h to be infused over 4 hours - Monitor renal function and clinical course  [facility] Center[facility]ifer [place], [address]Ph[address].D., BCPS Clinical Pharmacist Pager: [phone] 10/22/2014 4:42 PM

## 2014-10-22 NOTE — Progress Notes (Signed)
Patient resting comfortably this PM   NVI grossly with dry dressing   [facility]K [facility]   Would start bedside hydrotherapy tomorrow   Will follow with you

## 2014-10-23 LAB — CBC
HCT: 39.4 % (ref 39.0–52.0)
Hemoglobin: 13.3 g/dL (ref 13.0–17.0)
MCH: 30.5 pg (ref 26.0–34.0)
MCHC: 33.8 g/dL (ref 30.0–36.0)
MCV: 90.4 fL (ref 78.0–100.0)
PLATELETS: 267 10*3/uL (ref 150–400)
RBC: 4.36 MIL/uL (ref 4.22–5.81)
RDW: 13 % (ref 11.5–15.5)
WBC: 7.9 10*3/uL (ref 4.0–10.5)

## 2014-10-23 LAB — COMPREHENSIVE METABOLIC PANEL
ALT: 54 U/L — AB (ref 0–53)
AST: 44 U/L — ABNORMAL HIGH (ref 0–37)
Albumin: 2.3 g/dL — ABNORMAL LOW (ref 3.5–5.2)
Alkaline Phosphatase: 71 U/L (ref 39–117)
Anion gap: 9 (ref 5–15)
BILIRUBIN TOTAL: 0.4 mg/dL (ref 0.3–1.2)
BUN: 8 mg/dL (ref 6–23)
CO2: 27 mmol/L (ref 19–32)
Calcium: 8.7 mg/dL (ref 8.4–10.5)
Chloride: 103 mEq/L (ref 96–112)
Creatinine, Ser: 0.77 mg/dL (ref 0.50–1.35)
GLUCOSE: 111 mg/dL — AB (ref 70–99)
Potassium: 3.6 mmol/L (ref 3.5–5.1)
Sodium: 139 mmol/L (ref 135–145)
Total Protein: 5.5 g/dL — ABNORMAL LOW (ref 6.0–8.3)

## 2014-10-23 MED ORDER — HYDROMORPHONE HCL 1 MG/ML IJ SOLN
1.0000 mg | INTRAMUSCULAR | Status: DC | PRN
  Administered 2014-10-23 – 2014-10-26 (×19): 1 mg via INTRAVENOUS
  Filled 2014-10-23 (×20): qty 1

## 2014-10-23 MED ORDER — OXYCODONE HCL 5 MG PO TABS
10.0000 mg | ORAL_TABLET | ORAL | Status: DC | PRN
  Administered 2014-10-23 – 2014-10-26 (×15): 10 mg via ORAL
  Filled 2014-10-23 (×16): qty 2

## 2014-10-23 MED ORDER — NICOTINE 21 MG/24HR TD PT24
21.0000 mg | MEDICATED_PATCH | TRANSDERMAL | Status: DC
  Administered 2014-10-23 – 2014-10-25 (×3): 21 mg via TRANSDERMAL
  Filled 2014-10-23 (×5): qty 1

## 2014-10-23 NOTE — Progress Notes (Addendum)
[facility] PROGRESS NOTE  [name] ZOX:[id number] DOB: August 12, 1991 DOA: 10/22/2014 PCP: No primary care provider on file.  Assessment/Plan:  Right forearm necrotizing fasciitis/upper arm necrotizing fasciitis/compartment syndrome -Patient having a history of polysubstance abuse, injecting into his right arm, likely cause of his infection. -He underwent incision and drainage and fasciotomy of right forearm and upper arm on 10/20/2014. -Tolerated procedure well there no immediate complications. -Grams to stain showing abundant gram-negative rods, few gram-positive cocci -Blood cultures drawn on 10/19/2049 showing no growth 2 sets, wound cultures still pending -Patient was taken to the OR on 10/21/2014 undergoing further incision and drainage -Left AGAINST MEDICAL ADVICE on 10/21/2014 -Readmitted on 10/22/2014 -He was seen and evaluated by Dr Mina Marble of orthopedic surgery recommending starting hydrotherapy tomorrow -Continue IV Vancomycin and Zosyn -Await further recommendations from orthopedic surgery  Code Status: Full code Family Communication:  Disposition Plan: Anticipate discharge home when medically stable   Consultants:  Orthopedic surgery   Antibiotics:  IV vancomycin (restarted on 10/22/2014)  Cefepime (restarted on 10/22/2014)  HPI/Subjective: Patient is a 23 year old with a past medical history of intravenous drug use, alcoholism, who was recently admitted to the medicine service on 10/19/2014 for right arm necrotizing fasciitis likely complication of intravenous drug abuse. During that hospitalization he underwent right forearm incision and drainage and fasciotomy, procedure performed by Dr. Merlyn Lot. He was taken to the operating room again on 10/21/2014 undergoing further incision and drainage and debridement of previous wounds. When he cut back from the OR he left AGAINST MEDICAL ADVICE. Patient presented to [facility] complaining of severe right  upper extremity pain and drainage from surgical wounds. Patient was started on IV Zosyn and vancomycin.   Objective: Filed Vitals:   10/23/14 0523  BP: 138/94  Pulse: 63  Temp: 98.1 F (36.7 C)  Resp: 18    Intake/Output Summary (Last 24 hours) at 10/23/14 1542 Last data filed at 10/23/14 8119  Gross per 24 hour  Intake  584.5 ml  Output      0 ml  Net  584.5 ml   There were no vitals filed for this visit.  Exam:  General: Patient is awake alert oriented  Cardiovascular: Regular rate and rhythm normal S1-S2  Respiratory: Normal respiratory effort, lungs are clear to auscultation bilaterally  Abdomen: Soft nontender nondistended  Musculoskeletal: Right upper extremity in a cast, there is some swelling and erythema over his right hand, did not appreciate significant drainage   Data Reviewed: Basic Metabolic Panel:  Recent Labs Lab 10/19/14 1710 10/20/14 0619 10/21/14 0603 10/23/14 0510  NA 136 131* 133* 139  K 4.4 3.6 3.5 3.6  CL 102 100 100 103  CO2 23 22 26 27   GLUCOSE 122* 131* 130* 111*  BUN 8 5* <5* 8  CREATININE 0.75 0.80 0.70 0.77  CALCIUM 9.5 8.3* 8.3* 8.7   Liver Function Tests:  Recent Labs Lab 10/19/14 1710 10/20/14 0619 10/23/14 0510  AST 67* 38* 44*  ALT 97* 67* 54*  ALKPHOS 66 62 71  BILITOT 0.9 0.3 0.4  PROT 7.3 5.6* 5.5*  ALBUMIN 3.9 2.9* 2.3*   No results for input(s): LIPASE, AMYLASE in the last 168 hours. No results for input(s): AMMONIA in the last 168 hours. CBC:  Recent Labs Lab 10/19/14 1710 10/20/14 0619 10/21/14 0603 10/23/14 0510  WBC 27.6* 20.5* 17.2* 7.9  NEUTROABS 24.6* 18.1*  --   --   HGB 15.1 13.6 13.4 13.3  HCT 44.1 40.3 39.5 39.4  MCV 92.5 91.2  89.8 90.4  PLT 312 250 228 267   Cardiac Enzymes:  Recent Labs Lab 10/19/14 1710 10/20/14 0619  CKTOTAL 80 210   BNP (last 3 results) No results for input(s): PROBNP in the last 8760 hours. CBG: No results for input(s): GLUCAP in the last 168  hours.  Recent Results (from the past 240 hour(s))  Blood culture (routine x 2)     Status: None (Preliminary result)   Collection Time: 10/19/14  5:10 PM  Result Value Ref Range Status   Specimen Description BLOOD LEFT ANTECUBITAL DRAWN BY RN LS  Final   Special Requests BOTTLES DRAWN AEROBIC ONLY 4CC  Final   Culture NO GROWTH 4 DAYS  Final   Report Status PENDING  Incomplete  Blood culture (routine x 2)     Status: None (Preliminary result)   Collection Time: 10/19/14  5:15 PM  Result Value Ref Range Status   Specimen Description BLOOD LEFT HAND  Final   Special Requests BOTTLES DRAWN AEROBIC ONLY 4CC  Final   Culture NO GROWTH 4 DAYS  Final   Report Status PENDING  Incomplete  Wound culture     Status: None (Preliminary result)   Collection Time: 10/20/14  2:58 AM  Result Value Ref Range Status   Specimen Description WOUND RIGHT FOREARM  Final   Special Requests PATIENT ON FOLLOWING ZOSYN VANCOMYCIN  Final   Gram Stain   Final    ABUNDANT WBC PRESENT,BOTH PMN AND MONONUCLEAR NO SQUAMOUS EPITHELIAL CELLS SEEN ABUNDANT GRAM NEGATIVE RODS RARE GRAM POSITIVE RODS FEW GRAM POSITIVE COCCI IN PAIRS AND CHAINS    Culture   Final    Culture reincubated for better growth Performed at [facility]    Report Status PENDING  Incomplete  Gram stain     Status: None   Collection Time: 10/20/14  2:58 AM  Result Value Ref Range Status   Specimen Description WOUND RIGHT FOREARM  Final   Special Requests PATIENT ON FOLLOWING ZOSYN VANCOMYCIN  Final   Gram Stain   Final    ABUNDANT WBC PRESENT,BOTH PMN AND MONONUCLEAR ABUNDANT GRAM NEGATIVE RODS RARE GRAM POSITIVE RODS FEW GRAM POSITIVE COCCI IN PAIR AND CHAINS RARE YEAST    Report Status 10/20/2014 FINAL  Final  Anaerobic culture     Status: None (Preliminary result)   Collection Time: 10/20/14  2:58 AM  Result Value Ref Range Status   Specimen Description WOUND RIGHT FOREARM  Final   Special Requests PATIENT ON FOLLOWING  ZOSYN VANCOMYCIN  Final   Gram Stain   Final    ABUNDANT WBC PRESENT,BOTH PMN AND MONONUCLEAR NO SQUAMOUS EPITHELIAL CELLS SEEN ABUNDANT GRAM NEGATIVE RODS RARE GRAM POSITIVE RODS FEW GRAM POSITIVE COCCI IN PAIRS AND CHAINS    Culture   Final    NO ANAEROBES ISOLATED; CULTURE IN PROGRESS FOR 5 DAYS Performed at [facility]    Report Status PENDING  Incomplete     Studies: No results found.  Scheduled Meds: . enoxaparin (LOVENOX) injection  40 mg Subcutaneous Q24H  . folic acid  1 mg Oral Daily  . multivitamin with minerals  1 tablet Oral Daily  . nicotine  21 mg Transdermal Q24H  . piperacillin-tazobactam (ZOSYN)  IV  3.375 g Intravenous Q8H  . thiamine  100 mg Oral Daily   Or  . thiamine  100 mg Intravenous Daily  . vancomycin  1,000 mg Intravenous Q8H   Continuous Infusions: . sodium chloride 10 mL/hr at 10/22/14 1633  Principal Problem:   Necrotizing fasciitis Active Problems:   Polysubstance abuse- tobacco, alcohol, IVDA (Heroin/Cocaine), crack cocaine, THC    Time spent: 25 minutes    Jeralyn BennettZAMORA, [name]  [facility] Pager [phone]. If 7PM-7AM, please contact night-coverage at [email or web address], password [email or web address] 10/23/2014, 3:42 PM  LOS: 1 day

## 2014-10-23 NOTE — Progress Notes (Signed)
Physical Therapy Wound Treatment Patient Details  Name: Keldan Eplin MRN: [id number] Date of Birth: Jan 27, 1991  Today's Date: 10/23/2014 Time: 8144-8185 Time Calculation (min): 40 min  Subjective  Subjective: Patient c/o increased pain with dressing removal - 9/10. Patient and Family Stated Goals: To heal Prior Treatments: Patient admitted with necrotizing fascitis on 10/20/14.  Had surgical I&D on 10/21/14 and left hospital AMA on 10/21/14.  Was readmitted on 10/22/14 with increased pain level.  Pain Score: Pain Score: 9  Wound Assessment  Wound / Incision (Open or Dehisced) 10/23/14 Incision - Open Arm Right Forearm - Palmar surface (Active)  Dressing Type Impregnated gauze (bismuth);Gauze (Comment);Compression wrap;Non adherent 10/23/2014  4:19 PM  Dressing Changed Changed 10/23/2014  4:19 PM  Dressing Status Clean;Dry;Intact 10/23/2014  4:19 PM  Dressing Change Frequency Daily 10/23/2014  4:19 PM  Site / Wound Assessment Red;Other (Comment) 10/23/2014  4:19 PM  % Wound base Red or Granulating 95% 10/23/2014  4:19 PM  % Wound base Other (Comment) 5% 10/23/2014  4:19 PM  Peri-wound Assessment Edema;Purple 10/23/2014  4:19 PM  Wound Length (cm) 27 cm 10/23/2014  4:19 PM  Wound Width (cm) 4 cm 10/23/2014  4:19 PM  Margins Unattached edges (unapproximated) 10/23/2014  4:19 PM  Closure Sutures 10/23/2014  4:19 PM  Drainage Amount Moderate 10/23/2014  4:19 PM  Drainage Description No odor;Purulent 10/23/2014  4:19 PM  Treatment Cleansed;Hydrotherapy (Pulse lavage) 10/23/2014  4:19 PM     Wound / Incision (Open or Dehisced) 10/23/14 Incision - Open Arm Right Forearm - Dorsal surface (Active)  Dressing Type Impregnated gauze (bismuth);Compression wrap;Gauze (Comment) 10/23/2014  4:19 PM  Dressing Changed Changed 10/23/2014  4:19 PM  Dressing Status Clean;Dry;Intact 10/23/2014  4:19 PM  Dressing Change Frequency Daily 10/23/2014  4:19 PM  Site / Wound Assessment Red;Other (Comment)  10/23/2014  4:19 PM  % Wound base Red or Granulating 95% 10/23/2014  4:19 PM  % Wound base Other (Comment) 5% 10/23/2014  4:19 PM  Wound Length (cm) 11 cm 10/23/2014  4:19 PM  Wound Width (cm) 5 cm 10/23/2014  4:19 PM  Margins Unattached edges (unapproximated) 10/23/2014  4:19 PM  Closure None 10/23/2014  4:19 PM  Drainage Amount Moderate 10/23/2014  4:19 PM  Drainage Description Purulent;No odor 10/23/2014  4:19 PM  Treatment Cleansed;Hydrotherapy (Pulse lavage);Packing (Impregnated strip);Other (Comment) 10/23/2014  4:19 PM     Wound / Incision (Open or Dehisced) 10/23/14 Incision - Open Arm Right Upper arm - lower bicep area (Active)  Dressing Type Impregnated gauze (bismuth);Compression wrap;Gauze (Comment) 10/23/2014  4:19 PM  Dressing Changed Changed 10/23/2014  4:19 PM  Dressing Status Clean;Dry;Intact 10/23/2014  4:19 PM  Dressing Change Frequency Daily 10/23/2014  4:19 PM  Site / Wound Assessment Red;Other (Comment) 10/23/2014  4:19 PM  % Wound base Red or Granulating 90% 10/23/2014  4:19 PM  % Wound base Other (Comment) 10% 10/23/2014  4:19 PM  Peri-wound Assessment Edema;Purple 10/23/2014  4:19 PM  Wound Length (cm) 6.5 cm 10/23/2014  4:19 PM  Wound Width (cm) 6 cm 10/23/2014  4:19 PM  Margins Unattached edges (unapproximated) 10/23/2014  4:19 PM  Closure None 10/23/2014  4:19 PM  Drainage Amount Moderate 10/23/2014  4:19 PM  Drainage Description Purulent;No odor 10/23/2014  4:19 PM  Treatment Cleansed;Hydrotherapy (Pulse lavage);Packing (Impregnated strip) 10/23/2014  4:19 PM   Hydrotherapy Pulsed lavage therapy - wound location:  (Patient unable to tolerate PL today due to pain.) Cleaned wounds with NS and applied new dressings.  Wound Assessment  and Plan  Wound Therapy - Assess/Plan/Recommendations Wound Therapy - Clinical Statement: Wound fairly clean.  Patient unable to tolerate pulsatile lavage today due to pain.  Increased time to remove dressing/packing due to  pain. Wound Therapy - Functional Problem List: Decreased movement RUE, especially Lt elbow. Factors Delaying/Impairing Wound Healing: Substance abuse Hydrotherapy Plan: Debridement;Dressing change;Patient/family education;Pulsatile lavage with suction Wound Therapy - Frequency: 6X / week Wound Therapy - Current Recommendations: Case manager/social work Wound Therapy - Follow Up Recommendations: [place] (or Dr. Levell July office) Wound Plan: As above  Wound Therapy Goals- Improve the function of patient's integumentary system by progressing the wound(s) through the phases of wound healing (inflammation - proliferation - remodeling) by: Increase Granulation Tissue to: 100% Increase Granulation Tissue - Progress: Goal set today Improve Drainage Characteristics: Min;Serous Improve Drainage Characteristics - Progress: Goal set today Goals/treatment plan/discharge plan were made with and agreed upon by patient/family: Yes Time For Goal Achievement: 7 days Wound Therapy - Potential for Goals: Good  Goals will be updated until maximal potential achieved or discharge criteria met.  Discharge criteria: when goals achieved, discharge from hospital, MD decision/surgical intervention, no progress towards goals, refusal/missing three consecutive treatments without notification or medical reason.  GP     Despina Pole 10/23/2014, 5:05 PM Carita Pian. Sanjuana Kava, [place] Pager [phone]

## 2014-10-23 NOTE — Progress Notes (Signed)
CSW met with this 23 y/o, single, Caucasian, male due to social work consult.  Patient presents in hospital garb, lethargic, mood reported as "better."  Patient denies any case management needs.  Denies S/I, H/I, or any other mental health symptoms.  Patient states he has learned, "not to shoot anymore."  Patient denied any resources or referrals.  Patient agreed to call CSW if he had any needs.  Patient states that he will no longer use IV, but does not commit to abstinence.  States that he will stay away from Heroin that he believes that caused his problem and he will stick with "uppers, because I am an active guy."  Patient states he will stay till the attending physcian discharges him and will follow up with aftercare for his arm, but not interested in any mental health or substance abuse treatment nor 12 step meetings.  CSW signing off.  [facility] ED CSW [phone]

## 2014-10-23 NOTE — Progress Notes (Signed)
Patient became increasingly agitated and anxious when speaking with his mother tonight.  He restated he was feeling anxious at 1:30 AM.  He is now asleep and resting.

## 2014-10-23 NOTE — Progress Notes (Signed)
Pt's CIWA score at 14 d/t currently receiving hydrotherapy, Ativan given at this time.

## 2014-10-23 NOTE — Progress Notes (Signed)
Gave pt tylenol at 2249 d/t temp of 100.9. Pt then had to use the bathroom and was going to 'unhook' himself from his iv pump...said he had been doing this all day. I advised him not to 'unhook' himself from now on because I did not want bacteria, etc to get introduced into his bloodstream. He said he would comply. Pt also keeps pushing restart button on iv pump when it beeps. I advised him not to do that for his own safety and to call me if it beeps. When pt got up to use the restroom and took his iv pump with him this time, I found a lighter and pack of cigarettes in the bed where he was laying. I asked him if he had been smoking in the room (b/c the odor of smoke was very strong) and he said he had not b/c of the oxygen and everything. I advised him to continue doing his deep breathing and coughing to prevent pneumonia. He has some wheezes and congestive smokers cough

## 2014-10-24 LAB — WOUND CULTURE

## 2014-10-24 LAB — CULTURE, BLOOD (ROUTINE X 2)
CULTURE: NO GROWTH
Culture: NO GROWTH

## 2014-10-24 LAB — VANCOMYCIN, TROUGH: Vancomycin Tr: 7 ug/mL — ABNORMAL LOW (ref 10.0–20.0)

## 2014-10-24 MED ORDER — SODIUM CHLORIDE 0.9 % IV SOLN
1500.0000 mg | Freq: Three times a day (TID) | INTRAVENOUS | Status: DC
  Administered 2014-10-24 – 2014-10-25 (×4): 1500 mg via INTRAVENOUS
  Filled 2014-10-24 (×6): qty 1500

## 2014-10-24 NOTE — Progress Notes (Signed)
ANTIBIOTIC CONSULT NOTE - FOLLOW UP  Pharmacy Consult for Vancomycin Indication: R forearm nec fasc  No Known Allergies  Patient Measurements:   61.2 kg 64"  Vital Signs: Temp: 98.7 F (37.1 C) (12/27 0518) Temp Source: Oral (12/27 0518) BP: 135/72 mmHg (12/27 0518) Pulse Rate: 73 (12/27 0518) Intake/Output from previous day: 12/26 0701 - 12/27 0700 In: 1220 [P.O.:720; IV Piggyback:500] Out: -  Intake/Output from this shift:    Labs:  Recent Labs  10/23/14 0510  WBC 7.9  HGB 13.3  PLT 267  CREATININE 0.77   Estimated Creatinine Clearance: 121.3 mL/min (by C-G formula based on Cr of 0.77).  Recent Labs  10/24/14 0950  VANCOTROUGH 7.0*     Microbiology: Recent Results (from the past 720 hour(s))  Blood culture (routine x 2)     Status: None   Collection Time: 10/19/14  5:10 PM  Result Value Ref Range Status   Specimen Description BLOOD LEFT ANTECUBITAL DRAWN BY RN LS  Final   Special Requests BOTTLES DRAWN AEROBIC ONLY 4CC  Final   Culture NO GROWTH 5 DAYS  Final   Report Status 10/24/2014 FINAL  Final  Blood culture (routine x 2)     Status: None   Collection Time: 10/19/14  5:15 PM  Result Value Ref Range Status   Specimen Description BLOOD LEFT HAND  Final   Special Requests BOTTLES DRAWN AEROBIC ONLY 4CC  Final   Culture NO GROWTH 5 DAYS  Final   Report Status 10/24/2014 FINAL  Final  Wound culture     Status: None (Preliminary result)   Collection Time: 10/20/14  2:58 AM  Result Value Ref Range Status   Specimen Description WOUND RIGHT FOREARM  Final   Special Requests PATIENT ON FOLLOWING ZOSYN VANCOMYCIN  Final   Gram Stain   Final    ABUNDANT WBC PRESENT,BOTH PMN AND MONONUCLEAR NO SQUAMOUS EPITHELIAL CELLS SEEN ABUNDANT GRAM NEGATIVE RODS RARE GRAM POSITIVE RODS FEW GRAM POSITIVE COCCI IN PAIRS AND CHAINS    Culture   Final    Culture reincubated for better growth Performed at [facility]    Report Status PENDING  Incomplete   Gram stain     Status: None   Collection Time: 10/20/14  2:58 AM  Result Value Ref Range Status   Specimen Description WOUND RIGHT FOREARM  Final   Special Requests PATIENT ON FOLLOWING ZOSYN VANCOMYCIN  Final   Gram Stain   Final    ABUNDANT WBC PRESENT,BOTH PMN AND MONONUCLEAR ABUNDANT GRAM NEGATIVE RODS RARE GRAM POSITIVE RODS FEW GRAM POSITIVE COCCI IN PAIR AND CHAINS RARE YEAST    Report Status 10/20/2014 FINAL  Final  Anaerobic culture     Status: None (Preliminary result)   Collection Time: 10/20/14  2:58 AM  Result Value Ref Range Status   Specimen Description WOUND RIGHT FOREARM  Final   Special Requests PATIENT ON FOLLOWING ZOSYN VANCOMYCIN  Final   Gram Stain   Final    ABUNDANT WBC PRESENT,BOTH PMN AND MONONUCLEAR NO SQUAMOUS EPITHELIAL CELLS SEEN ABUNDANT GRAM NEGATIVE RODS RARE GRAM POSITIVE RODS FEW GRAM POSITIVE COCCI IN PAIRS AND CHAINS    Culture   Final    NO ANAEROBES ISOLATED; CULTURE IN PROGRESS FOR 5 DAYS Performed at [facility]    Report Status PENDING  Incomplete    Anti-infectives    Start     Dose/Rate Route Frequency Ordered Stop   10/22/14 1800  vancomycin (VANCOCIN) IVPB 1000 mg/200  mL premix     1,000 mg200 mL/hr over 60 Minutes Intravenous Every 8 hours 10/22/14 1655     10/22/14 1700  piperacillin-tazobactam (ZOSYN) IVPB 3.375 g     3.375 g12.5 mL/hr over 240 Minutes Intravenous Every 8 hours 10/22/14 1655        Assessment: 23 y/o male with hx of IVDA and RUE necrotizing fasciitis s/p I&D on 12/24 who left AMA yesterday but [facility] with RUE pain and drainage from surgical site. [facility]. Renal function is normal. Tmax is 99.1 and WBC down to 7.9. VT today was subtherapeutic at 7.0.  Goal of Therapy:  Vancomycin trough level 15-20 mcg/ml  Eradication of infection  Plan:  Increase Vancomycin to 1500 mg IV q8h Zosyn 3.375 g IV q8h to be infused over 4  hours Monitor renal function and clinical course, VT at Css  [name] 10/24/2014,11:01 AM

## 2014-10-24 NOTE — Progress Notes (Signed)
Pt would like to know if he can have order to shower?

## 2014-10-24 NOTE — Progress Notes (Signed)
Dressing on inside of elbow was wet to touch this morning around 0645. Pt in pain at a 10. He is not sure if it is drainage or sweat b/c he has been hot and sweating while sleeping. Could MD please assess wound to verify there is no problem with wound. Will pass on to day RN in report.

## 2014-10-24 NOTE — Progress Notes (Signed)
[facility] PROGRESS NOTE  Jared SangerRobert [name] WUJ:[id number] DOB: 04/17/1991 DOA: 10/22/2014 PCP: No primary care provider on file.  Assessment/Plan:  Right forearm necrotizing fasciitis/upper arm necrotizing fasciitis/compartment syndrome -Patient having a history of polysubstance abuse, injecting into his right arm, likely cause of his infection. -He underwent incision and drainage and fasciotomy of right forearm and upper arm on 10/20/2014. -Tolerated procedure well there no immediate complications. -Grams to stain showing abundant gram-negative rods, few gram-positive cocci -Blood cultures drawn on 10/19/2049 showing no growth 2 sets, wound cultures still pending -Patient was taken to the OR on 10/21/2014 undergoing further incision and drainage -Left AGAINST MEDICAL ADVICE on 10/21/2014 -Readmitted on 10/22/2014 -He was seen and evaluated by Dr Mina MarbleWeingold of orthopedic surgery -Pt to undergo hydrotherapy today -Continue IV Vancomycin and Zosyn -Await further recommendations from orthopedic surgery -Continue pain management with oxycodone 10 mg and Dilaudid 1 mg IV as needed for severe breakthrough pain   Code Status: Full code Family Communication:  Disposition Plan: Anticipate discharge home when medically stable   Consultants:  Orthopedic surgery   Antibiotics:  IV vancomycin (restarted on 10/22/2014)  Cefepime (restarted on 10/22/2014)  HPI/Subjective: Patient is a 23 year old with a past medical history of intravenous drug use, alcoholism, who was recently admitted to the medicine service on 10/19/2014 for right arm necrotizing fasciitis likely complication of intravenous drug abuse. During that hospitalization he underwent right forearm incision and drainage and fasciotomy, procedure performed by Dr. Merlyn LotKuzma. He was taken to the operating room again on 10/21/2014 undergoing further incision and drainage and debridement of previous wounds. When he cut back from the OR he  left AGAINST MEDICAL ADVICE. [facility] complaining of severe right upper extremity pain and drainage from surgical wounds. Patient was started on IV Zosyn and vancomycin.   Objective: Filed Vitals:   10/24/14 0518  BP: 135/72  Pulse: 73  Temp: 98.7 F (37.1 C)  Resp: 16    Intake/Output Summary (Last 24 hours) at 10/24/14 1212 Last data filed at 10/24/14 1136  Gross per 24 hour  Intake   1160 ml  Output      0 ml  Net   1160 ml   There were no vitals filed for this visit.  Exam:  General: Patient is awake alert oriented  Cardiovascular: Regular rate and rhythm normal S1-S2  Respiratory: Normal respiratory effort, lungs are clear to auscultation bilaterally  Abdomen: Soft nontender nondistended  Musculoskeletal: Right upper extremity in a cast, there is some swelling and erythema over his right hand, did not appreciate significant drainage   Data Reviewed: Basic Metabolic Panel:  Recent Labs Lab 10/19/14 1710 10/20/14 0619 10/21/14 0603 10/23/14 0510  NA 136 131* 133* 139  K 4.4 3.6 3.5 3.6  CL 102 100 100 103  CO2 23 22 26 27   GLUCOSE 122* 131* 130* 111*  BUN 8 5* <5* 8  CREATININE 0.75 0.80 0.70 0.77  CALCIUM 9.5 8.3* 8.3* 8.7   Liver Function Tests:  Recent Labs Lab 10/19/14 1710 10/20/14 0619 10/23/14 0510  AST 67* 38* 44*  ALT 97* 67* 54*  ALKPHOS 66 62 71  BILITOT 0.9 0.3 0.4  PROT 7.3 5.6* 5.5*  ALBUMIN 3.9 2.9* 2.3*   No results for input(s): LIPASE, AMYLASE in the last 168 hours. No results for input(s): AMMONIA in the last 168 hours. CBC:  Recent Labs Lab 10/19/14 1710 10/20/14 0619 10/21/14 0603 10/23/14 0510  WBC 27.6* 20.5* 17.2* 7.9  NEUTROABS 24.6* 18.1*  --   --  HGB 15.1 13.6 13.4 13.3  HCT 44.1 40.3 39.5 39.4  MCV 92.5 91.2 89.8 90.4  PLT 312 250 228 267   Cardiac Enzymes:  Recent Labs Lab 10/19/14 1710 10/20/14 0619  CKTOTAL 80 210   BNP (last 3 results) No results for  input(s): PROBNP in the last 8760 hours. CBG: No results for input(s): GLUCAP in the last 168 hours.  Recent Results (from the past 240 hour(s))  Blood culture (routine x 2)     Status: None   Collection Time: 10/19/14  5:10 PM  Result Value Ref Range Status   Specimen Description BLOOD LEFT ANTECUBITAL DRAWN BY RN LS  Final   Special Requests BOTTLES DRAWN AEROBIC ONLY 4CC  Final   Culture NO GROWTH 5 DAYS  Final   Report Status 10/24/2014 FINAL  Final  Blood culture (routine x 2)     Status: None   Collection Time: 10/19/14  5:15 PM  Result Value Ref Range Status   Specimen Description BLOOD LEFT HAND  Final   Special Requests BOTTLES DRAWN AEROBIC ONLY 4CC  Final   Culture NO GROWTH 5 DAYS  Final   Report Status 10/24/2014 FINAL  Final  Wound culture     Status: None   Collection Time: 10/20/14  2:58 AM  Result Value Ref Range Status   Specimen Description WOUND RIGHT FOREARM  Final   Special Requests PATIENT ON FOLLOWING ZOSYN VANCOMYCIN  Final   Gram Stain   Final    ABUNDANT WBC PRESENT,BOTH PMN AND MONONUCLEAR NO SQUAMOUS EPITHELIAL CELLS SEEN ABUNDANT GRAM NEGATIVE RODS RARE GRAM POSITIVE RODS FEW GRAM POSITIVE COCCI IN PAIRS AND CHAINS    Culture   Final    MODERATE STREPTOCOCCUS GROUP C Performed at [facility]    Report Status 10/24/2014 FINAL  Final  Gram stain     Status: None   Collection Time: 10/20/14  2:58 AM  Result Value Ref Range Status   Specimen Description WOUND RIGHT FOREARM  Final   Special Requests PATIENT ON FOLLOWING ZOSYN VANCOMYCIN  Final   Gram Stain   Final    ABUNDANT WBC PRESENT,BOTH PMN AND MONONUCLEAR ABUNDANT GRAM NEGATIVE RODS RARE GRAM POSITIVE RODS FEW GRAM POSITIVE COCCI IN PAIR AND CHAINS RARE YEAST    Report Status 10/20/2014 FINAL  Final  Anaerobic culture     Status: None (Preliminary result)   Collection Time: 10/20/14  2:58 AM  Result Value Ref Range Status   Specimen Description WOUND RIGHT FOREARM  Final    Special Requests PATIENT ON FOLLOWING ZOSYN VANCOMYCIN  Final   Gram Stain   Final    ABUNDANT WBC PRESENT,BOTH PMN AND MONONUCLEAR NO SQUAMOUS EPITHELIAL CELLS SEEN ABUNDANT GRAM NEGATIVE RODS RARE GRAM POSITIVE RODS FEW GRAM POSITIVE COCCI IN PAIRS AND CHAINS    Culture   Final    NO ANAEROBES ISOLATED; CULTURE IN PROGRESS FOR 5 DAYS Performed at [facility]    Report Status PENDING  Incomplete     Studies: No results found.  Scheduled Meds: . enoxaparin (LOVENOX) injection  40 mg Subcutaneous Q24H  . folic acid  1 mg Oral Daily  . multivitamin with minerals  1 tablet Oral Daily  . nicotine  21 mg Transdermal Q24H  . piperacillin-tazobactam (ZOSYN)  IV  3.375 g Intravenous Q8H  . thiamine  100 mg Oral Daily   Or  . thiamine  100 mg Intravenous Daily  . vancomycin  1,500 mg Intravenous Q8H  Continuous Infusions: . sodium chloride 10 mL/hr at 10/22/14 1633    Principal Problem:   Necrotizing fasciitis Active Problems:   Polysubstance abuse- tobacco, alcohol, IVDA (Heroin/Cocaine), crack cocaine, THC    Time spent: 25 minutes    [name]  [facility] Pager [phone]. If 7PM-7AM, please contact night-coverage at [email or web address], password [facility] 10/24/2014, 12:12 PM  LOS: 2 days

## 2014-10-25 ENCOUNTER — Encounter (HOSPITAL_COMMUNITY): Payer: Self-pay | Admitting: General Practice

## 2014-10-25 LAB — BASIC METABOLIC PANEL
Anion gap: 6 (ref 5–15)
BUN: 6 mg/dL (ref 6–23)
CALCIUM: 9 mg/dL (ref 8.4–10.5)
CO2: 29 mmol/L (ref 19–32)
Chloride: 104 mEq/L (ref 96–112)
Creatinine, Ser: 0.82 mg/dL (ref 0.50–1.35)
GFR calc Af Amer: >90 mL/min (ref >90)
GFR calc non Af Amer: >90 mL/min (ref >90)
GLUCOSE: 103 mg/dL — AB (ref 70–99)
POTASSIUM: 4 mmol/L (ref 3.5–5.1)
Sodium: 139 mmol/L (ref 135–145)

## 2014-10-25 LAB — CBC
HCT: 40.6 % (ref 39.0–52.0)
Hemoglobin: 13.6 g/dL (ref 13.0–17.0)
MCH: 30.8 pg (ref 26.0–34.0)
MCHC: 33.5 g/dL (ref 30.0–36.0)
MCV: 92.1 fL (ref 78.0–100.0)
Platelets: 343 10*3/uL (ref 150–400)
RBC: 4.41 MIL/uL (ref 4.22–5.81)
RDW: 12.9 % (ref 11.5–15.5)
WBC: 9.7 10*3/uL (ref 4.0–10.5)

## 2014-10-25 LAB — ANAEROBIC CULTURE

## 2014-10-25 MED ORDER — VANCOMYCIN HCL 10 G IV SOLR
1500.0000 mg | Freq: Two times a day (BID) | INTRAVENOUS | Status: DC
  Administered 2014-10-26: 1500 mg via INTRAVENOUS
  Filled 2014-10-25 (×2): qty 1500

## 2014-10-25 NOTE — Progress Notes (Addendum)
ANTIBIOTIC CONSULT NOTE - FOLLOW UP  Pharmacy Consult for Vancomycin Indication: R forearm nec fasc  No Known Allergies  Patient Measurements:   61.2 kg 64"  Vital Signs: Temp: 98.4 F (36.9 C) (12/28 1404) Temp Source: Oral (12/28 1404) BP: 130/57 mmHg (12/28 1404) Pulse Rate: 82 (12/28 1404) Intake/Output from previous day: 12/27 0701 - 12/28 0700 In: 1940 [P.O.:720; I.V.:120; IV Piggyback:1100] Out: -  Intake/Output from this shift:    Labs:  Recent Labs  10/23/14 0510 10/25/14 1056  WBC 7.9 9.7  HGB 13.3 13.6  PLT 267 343  CREATININE 0.77 0.82   Estimated Creatinine Clearance: 118.3 mL/min (by C-G formula based on Cr of 0.82).  Recent Labs  10/24/14 0950 10/25/14 1758  VANCOTROUGH 7.0* 25.6*     Microbiology: Recent Results (from the past 720 hour(s))  Blood culture (routine x 2)     Status: None   Collection Time: 10/19/14  5:10 PM  Result Value Ref Range Status   Specimen Description BLOOD LEFT ANTECUBITAL DRAWN BY RN LS  Final   Special Requests BOTTLES DRAWN AEROBIC ONLY 4CC  Final   Culture NO GROWTH 5 DAYS  Final   Report Status 10/24/2014 FINAL  Final  Blood culture (routine x 2)     Status: None   Collection Time: 10/19/14  5:15 PM  Result Value Ref Range Status   Specimen Description BLOOD LEFT HAND  Final   Special Requests BOTTLES DRAWN AEROBIC ONLY 4CC  Final   Culture NO GROWTH 5 DAYS  Final   Report Status 10/24/2014 FINAL  Final  Wound culture     Status: None   Collection Time: 10/20/14  2:58 AM  Result Value Ref Range Status   Specimen Description WOUND RIGHT FOREARM  Final   Special Requests PATIENT ON FOLLOWING ZOSYN VANCOMYCIN  Final   Gram Stain   Final    ABUNDANT WBC PRESENT,BOTH PMN AND MONONUCLEAR NO SQUAMOUS EPITHELIAL CELLS SEEN ABUNDANT GRAM NEGATIVE RODS RARE GRAM POSITIVE RODS FEW GRAM POSITIVE COCCI IN PAIRS AND CHAINS    Culture   Final    MODERATE STREPTOCOCCUS GROUP C Performed at [facility]     Report Status 10/24/2014 FINAL  Final  Gram stain     Status: None   Collection Time: 10/20/14  2:58 AM  Result Value Ref Range Status   Specimen Description WOUND RIGHT FOREARM  Final   Special Requests PATIENT ON FOLLOWING ZOSYN VANCOMYCIN  Final   Gram Stain   Final    ABUNDANT WBC PRESENT,BOTH PMN AND MONONUCLEAR ABUNDANT GRAM NEGATIVE RODS RARE GRAM POSITIVE RODS FEW GRAM POSITIVE COCCI IN PAIR AND CHAINS RARE YEAST    Report Status 10/20/2014 FINAL  Final  Anaerobic culture     Status: None   Collection Time: 10/20/14  2:58 AM  Result Value Ref Range Status   Specimen Description WOUND RIGHT FOREARM  Final   Special Requests PATIENT ON FOLLOWING ZOSYN VANCOMYCIN  Final   Gram Stain   Final    ABUNDANT WBC PRESENT,BOTH PMN AND MONONUCLEAR NO SQUAMOUS EPITHELIAL CELLS SEEN ABUNDANT GRAM NEGATIVE RODS RARE GRAM POSITIVE RODS FEW GRAM POSITIVE COCCI IN PAIRS AND CHAINS    Culture   Final    NO ANAEROBES ISOLATED Performed at [facility]    Report Status 10/25/2014 FINAL  Final    Anti-infectives    Start     Dose/Rate Route Frequency Ordered Stop   10/24/14 1100  vancomycin (VANCOCIN) 1,500 mg  in sodium chloride 0.9 % 500 mL IVPB     1,500 mg250 mL/hr over 120 Minutes Intravenous Every 8 hours 10/24/14 1107     10/22/14 1800  vancomycin (VANCOCIN) IVPB 1000 mg/200 mL premix  Status:  Discontinued     1,000 mg200 mL/hr over 60 Minutes Intravenous Every 8 hours 10/22/14 1655 10/24/14 1107   10/22/14 1700  piperacillin-tazobactam (ZOSYN) IVPB 3.375 g     3.375 g12.5 mL/hr over 240 Minutes Intravenous Every 8 hours 10/22/14 1655        Assessment: 23 y/o male with hx of IVDA and RUE necrotizing fasciitis s/p I&D on 12/24 who left AMA yesterday but [facility] with RUE pain and drainage from surgical site. Vanc dose was recently adjusted for subtherapeutic level. A repeat level today came back elevated. Plan is to dc home soon on abx. Blood  cultures are neg so far. Gram stain showed mixed organisms. Wound cx came back with group C strep which is usually sens to PCN.   Goal of Therapy:  Vanc trough = 10-15 Eradication of infection  Plan:   Decrease vanc to 1.5g IV q12 Zosyn 3.375 g IV q8h to be infused over 4 hours If plan to dc on PO abx, consider PO Cefuroxime to cover for strep and som GNR  Ulyses SouthwardMinh Orvella Digiulio, PharmD Pager: [phone] 10/25/2014 7:17 PM

## 2014-10-25 NOTE — Progress Notes (Signed)
[facility] PROGRESS NOTE  Jared SangerRobert Madara [id number] DOB: 07/23/1991 DOA: 10/22/2014 PCP: No primary care provider on file.  Assessment/Plan:  Right forearm necrotizing fasciitis/upper arm necrotizing fasciitis/compartment syndrome -Patient having a history of polysubstance abuse, injecting into his right arm, likely cause of his infection. -He underwent incision and drainage and fasciotomy of right forearm and upper arm on 10/20/2014. -Tolerated procedure well there no immediate complications. -Grams to stain showing abundant gram-negative rods, few gram-positive cocci -Blood cultures drawn on 10/19/2049 showing no growth 2 sets, wound cultures still pending -Patient was taken to the OR on 10/21/2014 undergoing further incision and drainage -Left AGAINST MEDICAL ADVICE on 10/21/2014 -Readmitted on 10/22/2014 -He was seen and evaluated by Dr Mina MarbleWeingold of orthopedic surgery -Continue pain management with oxycodone 10 mg and Dilaudid 1 mg IV as needed for severe breakthrough pain  -Patient undergoing hydrotherapy today. I evaluated surgical incision site, there was no evidence of infection  Code Status: Full code Family Communication: I spoke to his mother who was present at bedside.  Disposition Plan: Anticipate discharge home when medically stable in the next 24-48 hours   Consultants:  Orthopedic surgery   Antibiotics:  IV vancomycin (restarted on 10/22/2014)  Cefepime (restarted on 10/22/2014)  HPI/Subjective: Patient is a 23 year old with a past medical history of intravenous drug use, alcoholism, who was recently admitted to the medicine service on 10/19/2014 for right arm necrotizing fasciitis likely complication of intravenous drug abuse. During that hospitalization he underwent right forearm incision and drainage and fasciotomy, procedure performed by Dr. Merlyn LotKuzma. He was taken to the operating room again on 10/21/2014 undergoing further incision and drainage and  debridement of previous wounds. When he cut back from the OR he left AGAINST MEDICAL ADVICE. [facility] complaining of severe right upper extremity pain and drainage from surgical wounds. Patient was started on IV Zosyn and vancomycin.   Objective: Filed Vitals:   10/25/14 1404  BP: 130/57  Pulse: 82  Temp: 98.4 F (36.9 C)  Resp: 16    Intake/Output Summary (Last 24 hours) at 10/25/14 1614 Last data filed at 10/25/14 1500  Gross per 24 hour  Intake   1395 ml  Output      0 ml  Net   1395 ml   There were no vitals filed for this visit.  Exam:  General: Patient is awake alert oriented  Cardiovascular: Regular rate and rhythm normal S1-S2  Respiratory: Normal respiratory effort, lungs are clear to auscultation bilaterally  Abdomen: Soft nontender nondistended  Musculoskeletal: Surgical incision sites involving right forearm and volar forearm free of infection, did not observe purulence, overall had decreased swelling.    Data Reviewed: Basic Metabolic Panel:  Recent Labs Lab 10/19/14 1710 10/20/14 0619 10/21/14 0603 10/23/14 0510 10/25/14 1056  NA 136 131* 133* 139 139  K 4.4 3.6 3.5 3.6 4.0  CL 102 100 100 103 104  CO2 23 22 26 27 29   GLUCOSE 122* 131* 130* 111* 103*  BUN 8 5* <5* 8 6  CREATININE 0.75 0.80 0.70 0.77 0.82  CALCIUM 9.5 8.3* 8.3* 8.7 9.0   Liver Function Tests:  Recent Labs Lab 10/19/14 1710 10/20/14 0619 10/23/14 0510  AST 67* 38* 44*  ALT 97* 67* 54*  ALKPHOS 66 62 71  BILITOT 0.9 0.3 0.4  PROT 7.3 5.6* 5.5*  ALBUMIN 3.9 2.9* 2.3*   No results for input(s): LIPASE, AMYLASE in the last 168 hours. No results for input(s): AMMONIA in the last 168  hours. CBC:  Recent Labs Lab 10/19/14 1710 10/20/14 0619 10/21/14 0603 10/23/14 0510 10/25/14 1056  WBC 27.6* 20.5* 17.2* 7.9 9.7  NEUTROABS 24.6* 18.1*  --   --   --   HGB 15.1 13.6 13.4 13.3 13.6  HCT 44.1 40.3 39.5 39.4 40.6  MCV 92.5 91.2 89.8 90.4  92.1  PLT 312 250 228 267 343   Cardiac Enzymes:  Recent Labs Lab 10/19/14 1710 10/20/14 0619  CKTOTAL 80 210   BNP (last 3 results) No results for input(s): PROBNP in the last 8760 hours. CBG: No results for input(s): GLUCAP in the last 168 hours.  Recent Results (from the past 240 hour(s))  Blood culture (routine x 2)     Status: None   Collection Time: 10/19/14  5:10 PM  Result Value Ref Range Status   Specimen Description BLOOD LEFT ANTECUBITAL DRAWN BY RN LS  Final   Special Requests BOTTLES DRAWN AEROBIC ONLY 4CC  Final   Culture NO GROWTH 5 DAYS  Final   Report Status 10/24/2014 FINAL  Final  Blood culture (routine x 2)     Status: None   Collection Time: 10/19/14  5:15 PM  Result Value Ref Range Status   Specimen Description BLOOD LEFT HAND  Final   Special Requests BOTTLES DRAWN AEROBIC ONLY 4CC  Final   Culture NO GROWTH 5 DAYS  Final   Report Status 10/24/2014 FINAL  Final  Wound culture     Status: None   Collection Time: 10/20/14  2:58 AM  Result Value Ref Range Status   Specimen Description WOUND RIGHT FOREARM  Final   Special Requests PATIENT ON FOLLOWING ZOSYN VANCOMYCIN  Final   Gram Stain   Final    ABUNDANT WBC PRESENT,BOTH PMN AND MONONUCLEAR NO SQUAMOUS EPITHELIAL CELLS SEEN ABUNDANT GRAM NEGATIVE RODS RARE GRAM POSITIVE RODS FEW GRAM POSITIVE COCCI IN PAIRS AND CHAINS    Culture   Final    MODERATE STREPTOCOCCUS GROUP C Performed at [facility]    Report Status 10/24/2014 FINAL  Final  Gram stain     Status: None   Collection Time: 10/20/14  2:58 AM  Result Value Ref Range Status   Specimen Description WOUND RIGHT FOREARM  Final   Special Requests PATIENT ON FOLLOWING ZOSYN VANCOMYCIN  Final   Gram Stain   Final    ABUNDANT WBC PRESENT,BOTH PMN AND MONONUCLEAR ABUNDANT GRAM NEGATIVE RODS RARE GRAM POSITIVE RODS FEW GRAM POSITIVE COCCI IN PAIR AND CHAINS RARE YEAST    Report Status 10/20/2014 FINAL  Final  Anaerobic culture      Status: None   Collection Time: 10/20/14  2:58 AM  Result Value Ref Range Status   Specimen Description WOUND RIGHT FOREARM  Final   Special Requests PATIENT ON FOLLOWING ZOSYN VANCOMYCIN  Final   Gram Stain   Final    ABUNDANT WBC PRESENT,BOTH PMN AND MONONUCLEAR NO SQUAMOUS EPITHELIAL CELLS SEEN ABUNDANT GRAM NEGATIVE RODS RARE GRAM POSITIVE RODS FEW GRAM POSITIVE COCCI IN PAIRS AND CHAINS    Culture   Final    NO ANAEROBES ISOLATED Performed at [facility]    Report Status 10/25/2014 FINAL  Final     Studies: No results found.  Scheduled Meds: . enoxaparin (LOVENOX) injection  40 mg Subcutaneous Q24H  . folic acid  1 mg Oral Daily  . multivitamin with minerals  1 tablet Oral Daily  . nicotine  21 mg Transdermal Q24H  . piperacillin-tazobactam (ZOSYN)  IV  3.375 g Intravenous Q8H  . thiamine  100 mg Oral Daily   Or  . thiamine  100 mg Intravenous Daily  . vancomycin  1,500 mg Intravenous Q8H   Continuous Infusions: . sodium chloride 10 mL/hr at 10/22/14 1633    Principal Problem:   Necrotizing fasciitis Active Problems:   Polysubstance abuse- tobacco, alcohol, IVDA (Heroin/Cocaine), crack cocaine, THC    Time spent: 25 minutes    Jeralyn Bennett  [facility] Pager [phone]. If 7PM-7AM, please contact night-coverage at [email or web address], password [facility] 10/25/2014, 4:14 PM  LOS: 3 days

## 2014-10-25 NOTE — Progress Notes (Signed)
Utilization review completed. Cyril Woodmansee, RN, BSN. 

## 2014-10-25 NOTE — Progress Notes (Signed)
Physical Therapy Wound Treatment Patient Details  Name: [name] MRN: [id number] Date of Birth: 21-Aug-1991  Today's Date: 10/25/2014 Time: 8242-3536 Time Calculation (min): 53 min  Subjective  Subjective: Pt reports it wasn't as bad as dressing change the other day. Patient and Family Stated Goals: To heal Prior Treatments: Patient admitted with necrotizing fascitis on 10/20/14.  Had surgical I&D on 10/21/14 and left hospital AMA on 10/21/14.  Was readmitted on 10/22/14 with increased pain level.  Pain Score:    Wound Assessment  Wound / Incision (Open or Dehisced) 10/23/14 Incision - Open Arm Right Forearm - Palmar surface (Active)  Dressing Type Compression wrap;ABD;Impregnated gauze (bismuth) 10/25/2014 12:15 PM  Dressing Changed Changed 10/25/2014 12:15 PM  Dressing Status Clean;Dry;Intact 10/25/2014 12:15 PM  Dressing Change Frequency Daily 10/25/2014 12:15 PM  Site / Wound Assessment Dressing in place / Unable to assess 10/24/2014  8:36 AM  % Wound base Red or Granulating 95% 10/25/2014 12:15 PM  % Wound base Yellow 0% 10/25/2014 12:15 PM  % Wound base Black 0% 10/25/2014 12:15 PM  % Wound base Other (Comment) 5% 10/25/2014 12:15 PM  Peri-wound Assessment Edema 10/25/2014 12:15 PM  Wound Length (cm) 27 cm 10/23/2014  4:19 PM  Wound Width (cm) 4 cm 10/23/2014  4:19 PM  Margins Unattached edges (unapproximated) 10/25/2014 12:15 PM  Closure Sutures 10/25/2014 12:15 PM  Drainage Amount Minimal 10/25/2014 12:15 PM  Drainage Description Serosanguineous 10/25/2014 12:15 PM  Treatment Hydrotherapy (Pulse lavage) 10/25/2014 12:15 PM     Wound / Incision (Open or Dehisced) 10/23/14 Incision - Open Arm Right Forearm - Dorsal surface (Active)  Dressing Type ABD;Compression wrap;Impregnated gauze (bismuth);Gauze (Comment) 10/25/2014 12:15 PM  Dressing Changed Changed 10/25/2014 12:15 PM  Dressing Status Dry;Clean;Intact 10/25/2014 12:15 PM  Dressing Change Frequency Daily  10/25/2014 12:15 PM  Site / Wound Assessment Dressing in place / Unable to assess 10/24/2014  8:36 AM  % Wound base Red or Granulating 100% 10/25/2014 12:15 PM  % Wound base Yellow 0% 10/25/2014 12:15 PM  % Wound base Black 0% 10/25/2014 12:15 PM  % Wound base Other (Comment) 0% 10/25/2014 12:15 PM  Peri-wound Assessment Edema 10/25/2014 12:15 PM  Wound Length (cm) 11 cm 10/23/2014  4:19 PM  Wound Width (cm) 5 cm 10/23/2014  4:19 PM  Margins Unattached edges (unapproximated) 10/25/2014 12:15 PM  Closure None 10/25/2014 12:15 PM  Drainage Amount Minimal 10/25/2014 12:15 PM  Drainage Description Serosanguineous 10/25/2014 12:15 PM  Treatment Hydrotherapy (Pulse lavage) 10/25/2014 12:15 PM     Wound / Incision (Open or Dehisced) 10/23/14 Incision - Open Arm Right Upper arm - lower bicep area (Active)  Dressing Type ABD;Gauze (Comment);Impregnated gauze (bismuth);Compression wrap 10/25/2014 12:15 PM  Dressing Changed Changed 10/25/2014 12:15 PM  Dressing Status Clean;Dry;Intact 10/25/2014 12:15 PM  Dressing Change Frequency Daily 10/25/2014 12:15 PM  Site / Wound Assessment Dressing in place / Unable to assess 10/24/2014  8:36 AM  % Wound base Red or Granulating 90% 10/25/2014 12:15 PM  % Wound base Yellow 0% 10/25/2014 12:15 PM  % Wound base Black 0% 10/25/2014 12:15 PM  % Wound base Other (Comment) 10% 10/25/2014 12:15 PM  Peri-wound Assessment Edema 10/25/2014 12:15 PM  Wound Length (cm) 6.5 cm 10/23/2014  4:19 PM  Wound Width (cm) 6 cm 10/23/2014  4:19 PM  Margins Unattached edges (unapproximated) 10/25/2014 12:15 PM  Closure None 10/25/2014 12:15 PM  Drainage Amount Moderate 10/25/2014 12:15 PM  Drainage Description Serosanguineous 10/25/2014 12:15 PM  Treatment Hydrotherapy (Pulse lavage)  10/25/2014 12:15 PM      Hydrotherapy Pulsed lavage therapy - wound location: forearm and upper arm Pulsed Lavage with Suction (psi): 4 psi Pulsed Lavage with Suction - Normal Saline Used:  1000 mL Pulsed Lavage Tip: Tip with splash shield   Wound Assessment and Plan  Wound Therapy - Assess/Plan/Recommendations Wound Therapy - Clinical Statement: Wound beds clean. Pt able to tolerate pulsatile lavage today. Wound Therapy - Functional Problem List: Decreased movement RUE, especially Lt elbow. Factors Delaying/Impairing Wound Healing: Substance abuse Hydrotherapy Plan: Debridement;Dressing change;Patient/family education;Pulsatile lavage with suction Wound Therapy - Frequency: 6X / week Wound Therapy - Follow Up Recommendations: Thurman Wound Plan: As above  Wound Therapy Goals- Improve the function of patient's integumentary system by progressing the wound(s) through the phases of wound healing (inflammation - proliferation - remodeling) by: Increase Granulation Tissue to: 100% Increase Granulation Tissue - Progress: Progressing toward goal Improve Drainage Characteristics: Min;Serous Improve Drainage Characteristics - Progress: Progressing toward goal  Goals will be updated until maximal potential achieved or discharge criteria met.  Discharge criteria: when goals achieved, discharge from hospital, MD decision/surgical intervention, no progress towards goals, refusal/missing three consecutive treatments without notification or medical reason.  GP     Tamerra Merkley 10/25/2014, 2:27 PM  [facility] PT [phone]

## 2014-10-25 NOTE — Progress Notes (Signed)
Subjective:     Patient reports pain as controlled.  Had hydrotherapy with packing change today and states it was tolerable.  Patient states would looked good.  Objective: Vital signs in last 24 hours: Temp:  [98.3 F (36.8 C)-98.7 F (37.1 C)] 98.4 F (36.9 C) (12/28 1404) Pulse Rate:  [73-90] 82 (12/28 1404) Resp:  [16-18] 16 (12/28 1404) BP: (130-149)/(57-101) 130/57 mmHg (12/28 1404) SpO2:  [97 %-100 %] 99 % (12/28 1404)  Intake/Output from previous day: 12/27 0701 - 12/28 0700 In: 1940 [P.O.:720; I.V.:120; IV Piggyback:1100] Out: -  Intake/Output this shift: Total I/O In: 485 [P.O.:485] Out: -    Recent Labs  10/23/14 0510 10/25/14 1056  HGB 13.3 13.6    Recent Labs  10/23/14 0510 10/25/14 1056  WBC 7.9 9.7  RBC 4.36 4.41  HCT 39.4 40.6  PLT 267 343    Recent Labs  10/23/14 0510 10/25/14 1056  NA 139 139  K 3.6 4.0  CL 103 104  CO2 27 29  BUN 8 6  CREATININE 0.77 0.82  GLUCOSE 111* 103*  CALCIUM 8.7 9.0   No results for input(s): LABPT, INR in the last 72 hours.  intact sensation and capillary refill all digits.  +epl/fpl/io.  swelling and erythema of hand resolved.  no proximal streaks.  dressing clean/dry/intact.  Assessment/Plan:     Okay for discharge home from hand surgery standpoint.  Will need outpatient hydrotherapy.  Can set this up at [facility].  Follow up with me in office 1 week.  Antibiotics and pain meds per hospitalist service.  Hae Ahlers R 10/25/2014, 4:45 PM

## 2014-10-26 LAB — VANCOMYCIN, TROUGH: Vancomycin Tr: 25.6 ug/mL (ref 10.0–20.0)

## 2014-10-26 MED ORDER — OXYCODONE HCL 10 MG PO TABS: 10.0000 mg | ORAL_TABLET | Freq: Four times a day (QID) | ORAL | Status: AC | PRN

## 2014-10-26 MED ORDER — OXYCODONE HCL 5 MG PO TABS
10.0000 mg | ORAL_TABLET | Freq: Once | ORAL | Status: AC
  Administered 2014-10-26: 10 mg via ORAL
  Filled 2014-10-26: qty 2

## 2014-10-26 MED ORDER — DOXYCYCLINE HYCLATE 50 MG PO CAPS: 100.0000 mg | ORAL_CAPSULE | Freq: Two times a day (BID) | ORAL | Status: AC

## 2014-10-26 NOTE — Progress Notes (Signed)
D/C instructions and scripts given. Pt set up with outpt hydro per MD order. IV D/Cd and Pts mom at the bedside to take Pt home at this time.

## 2014-10-26 NOTE — Discharge Summary (Signed)
Physician Discharge Summary  Joash Tony ZOX:[id number] DOB: 1990-11-28 DOA: 10/22/2014  PCP: No primary care provider on file.  Admit date: 10/22/2014 Discharge date: 10/26/2014  Time spent: 35 minutes  Recommendations for Outpatient Follow-up:  1. Patient will require ongoing hydrotherapy to be done at [facility] 2. Please follow up on wound, was discharged on Doxy 100 mg PO BID for 10 days 3. Needs to follow up with Dr Merlyn Lot of Orthopedic Surgery 1 week post discharge. CM consulted with assistance for hospital follow up appointments  Discharge Diagnoses:  Principal Problem:   Necrotizing fasciitis Active Problems:   Polysubstance abuse- tobacco, alcohol, IVDA (Heroin/Cocaine), crack cocaine, THC   Discharge Condition: Stable  Diet recommendation: Regular Diet  History of present illness:    Hospital Course:  Patient is a 23 year old with a past medical history of intravenous drug use, alcoholism, who was recently admitted to the medicine service on 10/19/2014 for right arm necrotizing fasciitis likely complication of intravenous drug abuse. During that hospitalization he underwent right forearm incision and drainage and fasciotomy, procedure performed by Dr. Merlyn Lot. He was taken to the operating room again on 10/21/2014 undergoing further incision and drainage and debridement of previous wounds. When he cut back from the OR he left AGAINST MEDICAL ADVICE. Patient presented to [facility] complaining of severe right upper extremity pain and drainage from surgical wounds. Patient was started on IV Zosyn and vancomycin.   Right forearm necrotizing fasciitis/upper arm necrotizing fasciitis/compartment syndrome -Patient having a history of polysubstance abuse, injecting into his right arm, likely cause of his infection. -He underwent incision and drainage and fasciotomy of right forearm and upper arm on 10/20/2014. -Tolerated procedure well there no immediate  complications. -Grams to stain showing abundant gram-negative rods, few gram-positive cocci -Blood cultures drawn on 10/19/2049 showing no growth 2 sets, wound cultures still pending -Patient was taken to the OR on 10/21/2014 undergoing further incision and drainage -Left AGAINST MEDICAL ADVICE on 10/21/2014 -Readmitted on 10/22/2014 -He was seen and evaluated by Dr Mina Marble of orthopedic surgery -Continue pain management with oxycodone 10 mg and Dilaudid 1 mg IV as needed for severe breakthrough pain  -Patient undergoing hydrotherapy during this hospitalization. I evaluated surgical incision site, there was no evidence of infection -Discharged on 10/26/2014 on Doxy   Consultations:  Orthopedic Surgery  Case Manager  Discharge Exam: Filed Vitals:   10/26/14 0626  BP: 107/60  Pulse: 80  Temp: 98.4 F (36.9 C)  Resp:     General: Patient is awake alert oriented  Cardiovascular: Regular rate and rhythm normal S1-S2  Respiratory: Normal respiratory effort, lungs are clear to auscultation bilaterally  Abdomen: Soft nontender nondistended  Musculoskeletal: Surgical incision sites involving right forearm and volar forearm free of infection, did not observe purulence, overall had decreased swelling.   Discharge Instructions   Discharge Instructions    Call MD for:  difficulty breathing, headache or visual disturbances    Complete by:  As directed      Call MD for:  extreme fatigue    Complete by:  As directed      Call MD for:  hives    Complete by:  As directed      Call MD for:  persistant dizziness or light-headedness    Complete by:  As directed      Call MD for:  persistant nausea and vomiting    Complete by:  As directed      Call MD for:  redness, tenderness, or  signs of infection (pain, swelling, redness, odor or green/yellow discharge around incision site)    Complete by:  As directed      Call MD for:  severe uncontrolled pain    Complete by:  As directed       Call MD for:  temperature >100.4    Complete by:  As directed      Diet - low sodium heart healthy    Complete by:  As directed      Increase activity slowly    Complete by:  As directed           Current Discharge Medication List    START taking these medications   Details  doxycycline (VIBRAMYCIN) 50 MG capsule Take 2 capsules (100 mg total) by mouth 2 (two) times daily. Qty: 40 capsule, Refills: 0    oxyCODONE 10 MG TABS Take 1 tablet (10 mg total) by mouth every 6 (six) hours as needed for severe pain. Qty: 15 tablet, Refills: 0      STOP taking these medications     ibuprofen (ADVIL,MOTRIN) 200 MG tablet      oxyCODONE-acetaminophen (PERCOCET/ROXICET) 5-325 MG per tablet        No Known Allergies Follow-up Information    Follow up with Tami Ribas, MD In 1 week.   Specialty:  Orthopedic Surgery   Contact information:   [address] [place] [address] [phone]        The results of significant diagnostics from this hospitalization (including imaging, microbiology, ancillary and laboratory) are listed below for reference.    Significant Diagnostic Studies: Dg Chest 2 View  10/19/2014   CLINICAL DATA:  Wheezing.  Right arm pain and swelling.  EXAM: CHEST  2 VIEW  COMPARISON:  None.  FINDINGS: The heart size and mediastinal contours are within normal limits. Both lungs are clear. No evidence of pleural effusion. Old right clavicle fracture deformity incidentally noted.  IMPRESSION: No active cardiopulmonary disease.   Electronically Signed   By: Myles Rosenthal M.D.   On: 10/19/2014 17:43   Dg Forearm Right  10/19/2014   CLINICAL DATA:  Right arm pain, swelling, and erythema. IV drug use.  EXAM: RIGHT FOREARM - 2 VIEW  COMPARISON:  None.  FINDINGS: Subcutaneous emphysema is seen in the proximal forearm. No evidence of osteolysis or periostitis. No fracture or other bone lesion seen involving the radius or ulna. No radiopaque foreign body identified.   IMPRESSION: Subcutaneous emphysema and proximal forearm. No osseous abnormality visualized.   Electronically Signed   By: Myles Rosenthal M.D.   On: 10/19/2014 17:49   Mr Humerus Right W Wo Contrast  10/20/2014   CLINICAL DATA:  Redness, pain and swelling of the right upper extremity for several days which has worsened 10/19/2014. IV drug abuser.  EXAM: MRI OF THE RIGHT FOREARM WITHOUT AND WITH CONTRAST; MRI OF THE RIGHT HUMERUS WITHOUT AND WITH CONTRAST  TECHNIQUE: Multiplanar, multisequence MR imaging was performed both before and after administration of intravenous contrast.  CONTRAST:  10 mL MULTIHANCE GADOBENATE DIMEGLUMINE 529 MG/ML IV SOLN  COMPARISON:  Plain films of the right arm this same day.  FINDINGS: The study is somewhat limited due to large field-of-view. The patient's arm is in charge the patient's elbow is in 90 degrees of flexion also limiting the examination.  Subcutaneous edema and enhancement are identified consistent with cellulitis. Gas is seen tracking in the anterior soft tissues of the forearm and inferior upper arm. Gas is best  visualized tracking between the supinator, extensor carpi radialis longus and pronator teres muscles. A rim enhancing fluid collection with air-fluid levels is identified centered in the pronator teres. Discrete measurement is difficult due to positioning but the fluid collection measures approximately 5.0 cm craniocaudal by up to 2.8 cm transverse by approximately 3.4 cm AP. There is no bone marrow signal abnormality to suggest osteomyelitis.  IMPRESSION: Findings consistent with cellulitis, pyomyositis and fasciitis centered about the elbow with extending into the forearm. Abscess which appears to lie in the pronator teres is identified as described above. Negative for osteomyelitis.   Electronically Signed   By: Drusilla Kannerhomas  Dalessio M.D.   On: 10/20/2014 08:24   Mr Forearm Right Wo/w Cm  10/20/2014   CLINICAL DATA:  Redness, pain and swelling of the right upper  extremity for several days which has worsened 10/19/2014. IV drug abuser.  EXAM: MRI OF THE RIGHT FOREARM WITHOUT AND WITH CONTRAST; MRI OF THE RIGHT HUMERUS WITHOUT AND WITH CONTRAST  TECHNIQUE: Multiplanar, multisequence MR imaging was performed both before and after administration of intravenous contrast.  CONTRAST:  10 mL MULTIHANCE GADOBENATE DIMEGLUMINE 529 MG/ML IV SOLN  COMPARISON:  Plain films of the right arm this same day.  FINDINGS: The study is somewhat limited due to large field-of-view. The patient's arm is in charge the patient's elbow is in 90 degrees of flexion also limiting the examination.  Subcutaneous edema and enhancement are identified consistent with cellulitis. Gas is seen tracking in the anterior soft tissues of the forearm and inferior upper arm. Gas is best visualized tracking between the supinator, extensor carpi radialis longus and pronator teres muscles. A rim enhancing fluid collection with air-fluid levels is identified centered in the pronator teres. Discrete measurement is difficult due to positioning but the fluid collection measures approximately 5.0 cm craniocaudal by up to 2.8 cm transverse by approximately 3.4 cm AP. There is no bone marrow signal abnormality to suggest osteomyelitis.  IMPRESSION: Findings consistent with cellulitis, pyomyositis and fasciitis centered about the elbow with extending into the forearm. Abscess which appears to lie in the pronator teres is identified as described above. Negative for osteomyelitis.   Electronically Signed   By: Drusilla Kannerhomas  Dalessio M.D.   On: 10/20/2014 08:24    Microbiology: Recent Results (from the past 240 hour(s))  Blood culture (routine x 2)     Status: None   Collection Time: 10/19/14  5:10 PM  Result Value Ref Range Status   Specimen Description BLOOD LEFT ANTECUBITAL DRAWN BY RN LS  Final   Special Requests BOTTLES DRAWN AEROBIC ONLY 4CC  Final   Culture NO GROWTH 5 DAYS  Final   Report Status 10/24/2014 FINAL   Final  Blood culture (routine x 2)     Status: None   Collection Time: 10/19/14  5:15 PM  Result Value Ref Range Status   Specimen Description BLOOD LEFT HAND  Final   Special Requests BOTTLES DRAWN AEROBIC ONLY 4CC  Final   Culture NO GROWTH 5 DAYS  Final   Report Status 10/24/2014 FINAL  Final  Wound culture     Status: None   Collection Time: 10/20/14  2:58 AM  Result Value Ref Range Status   Specimen Description WOUND RIGHT FOREARM  Final   Special Requests PATIENT ON FOLLOWING ZOSYN VANCOMYCIN  Final   Gram Stain   Final    ABUNDANT WBC PRESENT,BOTH PMN AND MONONUCLEAR NO SQUAMOUS EPITHELIAL CELLS SEEN ABUNDANT GRAM NEGATIVE RODS RARE GRAM POSITIVE RODS FEW  GRAM POSITIVE COCCI IN PAIRS AND CHAINS    Culture   Final    MODERATE STREPTOCOCCUS GROUP C [facility]formed at [facility]    Report Status 10/24/2014 FINAL  Final  Gram stain     Status: None   Collection Time: 10/20/14  2:58 AM  Result Value Ref Range Status   Specimen Description WOUND RIGHT FOREARM  Final   Special Requests PATIENT ON FOLLOWING ZOSYN VANCOMYCIN  Final   Gram Stain   Final    ABUNDANT WBC PRESENT,BOTH PMN AND MONONUCLEAR ABUNDANT GRAM NEGATIVE RODS RARE GRAM POSITIVE RODS FEW GRAM POSITIVE COCCI IN PAIR AND CHAINS RARE YEAST    Report Status 10/20/2014 FINAL  Final  Anaerobic culture     Status: None   Collection Time: 10/20/14  2:58 AM  Result Value Ref Range Status   Specimen Description WOUND RIGHT FOREARM  Final   Special Requests PATIENT ON FOLLOWING ZOSYN VANCOMYCIN  Final   Gram Stain   Final    ABUNDANT WBC PRESENT,BOTH PMN AND MONONUCLEAR NO SQUAMOUS EPITHELIAL CELLS SEEN ABUNDANT GRAM NEGATIVE RODS RARE GRAM POSITIVE RODS FEW GRAM POSITIVE COCCI IN PAIRS AND CHAINS    Culture   Final    NO ANAEROBES ISOLATED Performed at [facility]    Report Status 10/25/2014 FINAL  Final     Labs: Basic Metabolic Panel:  Recent Labs Lab 10/19/14 1710 10/20/14 0619  10/21/14 0603 10/23/14 0510 10/25/14 1056  NA 136 131* 133* 139 139  K 4.4 3.6 3.5 3.6 4.0  CL 102 100 100 103 104  CO2 23 22 26 27 29   GLUCOSE 122* 131* 130* 111* 103*  BUN 8 5* <5* 8 6  CREATININE 0.75 0.80 0.70 0.77 0.82  CALCIUM 9.5 8.3* 8.3* 8.7 9.0   Liver Function Tests:  Recent Labs Lab 10/19/14 1710 10/20/14 0619 10/23/14 0510  AST 67* 38* 44*  ALT 97* 67* 54*  ALKPHOS 66 62 71  BILITOT 0.9 0.3 0.4  PROT 7.3 5.6* 5.5*  ALBUMIN 3.9 2.9* 2.3*   No results for input(s): LIPASE, AMYLASE in the last 168 hours. No results for input(s): AMMONIA in the last 168 hours. CBC:  Recent Labs Lab 10/19/14 1710 10/20/14 0619 10/21/14 0603 10/23/14 0510 10/25/14 1056  WBC 27.6* 20.5* 17.2* 7.9 9.7  NEUTROABS 24.6* 18.1*  --   --   --   HGB 15.1 13.6 13.4 13.3 13.6  HCT 44.1 40.3 39.5 39.4 40.6  MCV 92.5 91.2 89.8 90.4 92.1  PLT 312 250 228 267 343   Cardiac Enzymes:  Recent Labs Lab 10/19/14 1710 10/20/14 0619  CKTOTAL 80 210   BNP: BNP (last 3 results) No results for input(s): PROBNP in the last 8760 hours. CBG: No results for input(s): GLUCAP in the last 168 hours.     SignedJeralyn Bennett:  Cindi Ghazarian  [facility] 10/26/2014, 8:54 AM

## 2014-10-26 NOTE — Progress Notes (Signed)
CARE MANAGEMENT NOTE 10/26/2014  Patient:  Fawcett,Lenard   Account Number:  [id number]  Date Initiated:  10/26/2014  Documentation initiated by:  [facility]Ravon Mortellaro  Subjective/Objective Assessment:   cellulitis rt arm, s/p I&D     Action/Plan:   to have outpatient hydrotherapy at [facility]-scheduled appt for 9:30 10/27/14 and faxed copy of order to HickmanNancy at [phone]   Anticipated DC Date:  10/26/2014   Anticipated DC Plan:  HOME/SELF CARE      DC Planning Services  CM consult      Choice offered to / List presented to:             Status of service:  Completed, signed off Medicare Important Message given?   (If response is "NO", the following Medicare IM given date fields will be blank) Date Medicare IM given:   Medicare IM given by:   Date Additional Medicare IM given:   Additional Medicare IM given by:    Discharge Disposition:  HOME/SELF CARE  Per UR Regulation:  Reviewed for med. necessity/level of care/duration of stay  If discussed at Long Length of Stay Meetings, dates discussed:    Comments:  10/26/14 Gave patient and his mother appt time for outpatient hydrotherapy, 9:30 10/27/14,  as well as phone number and address for [facility]. Jacquelynn CreeMary Euna Armon RN, BSN, CCM

## 2014-10-26 NOTE — Progress Notes (Signed)
PT Cancellation Note  Patient Details Name: Jared SangerRobert [name] MRN: [id number] DOB: 11/18/1990   Cancelled Treatment:    Reason Eval/Treat Not Completed: Other (comment) (Pt left hospital prior to hydrotherapy session).  10/26/2014  [place] BingKen Tylar Amborn, PT [phone]  (pager)   Ikenna Ohms, Eliseo GumKenneth V 10/26/2014, 11:29 AM

## 2014-10-27 ENCOUNTER — Ambulatory Visit (HOSPITAL_COMMUNITY)
Admission: AD | Admit: 2014-10-27 | Payer: No Typology Code available for payment source | Source: Other Acute Inpatient Hospital | Attending: Internal Medicine | Admitting: Internal Medicine

## 2016-07-02 IMAGING — CR DG CHEST 2V
2 series · 2 of 2 positions shown · non-contrast
Comparison: None.

CLINICAL DATA: Wheezing.  Right arm pain and swelling.

EXAM:
CHEST  2 VIEW

[view not recorded (1 of 2)]
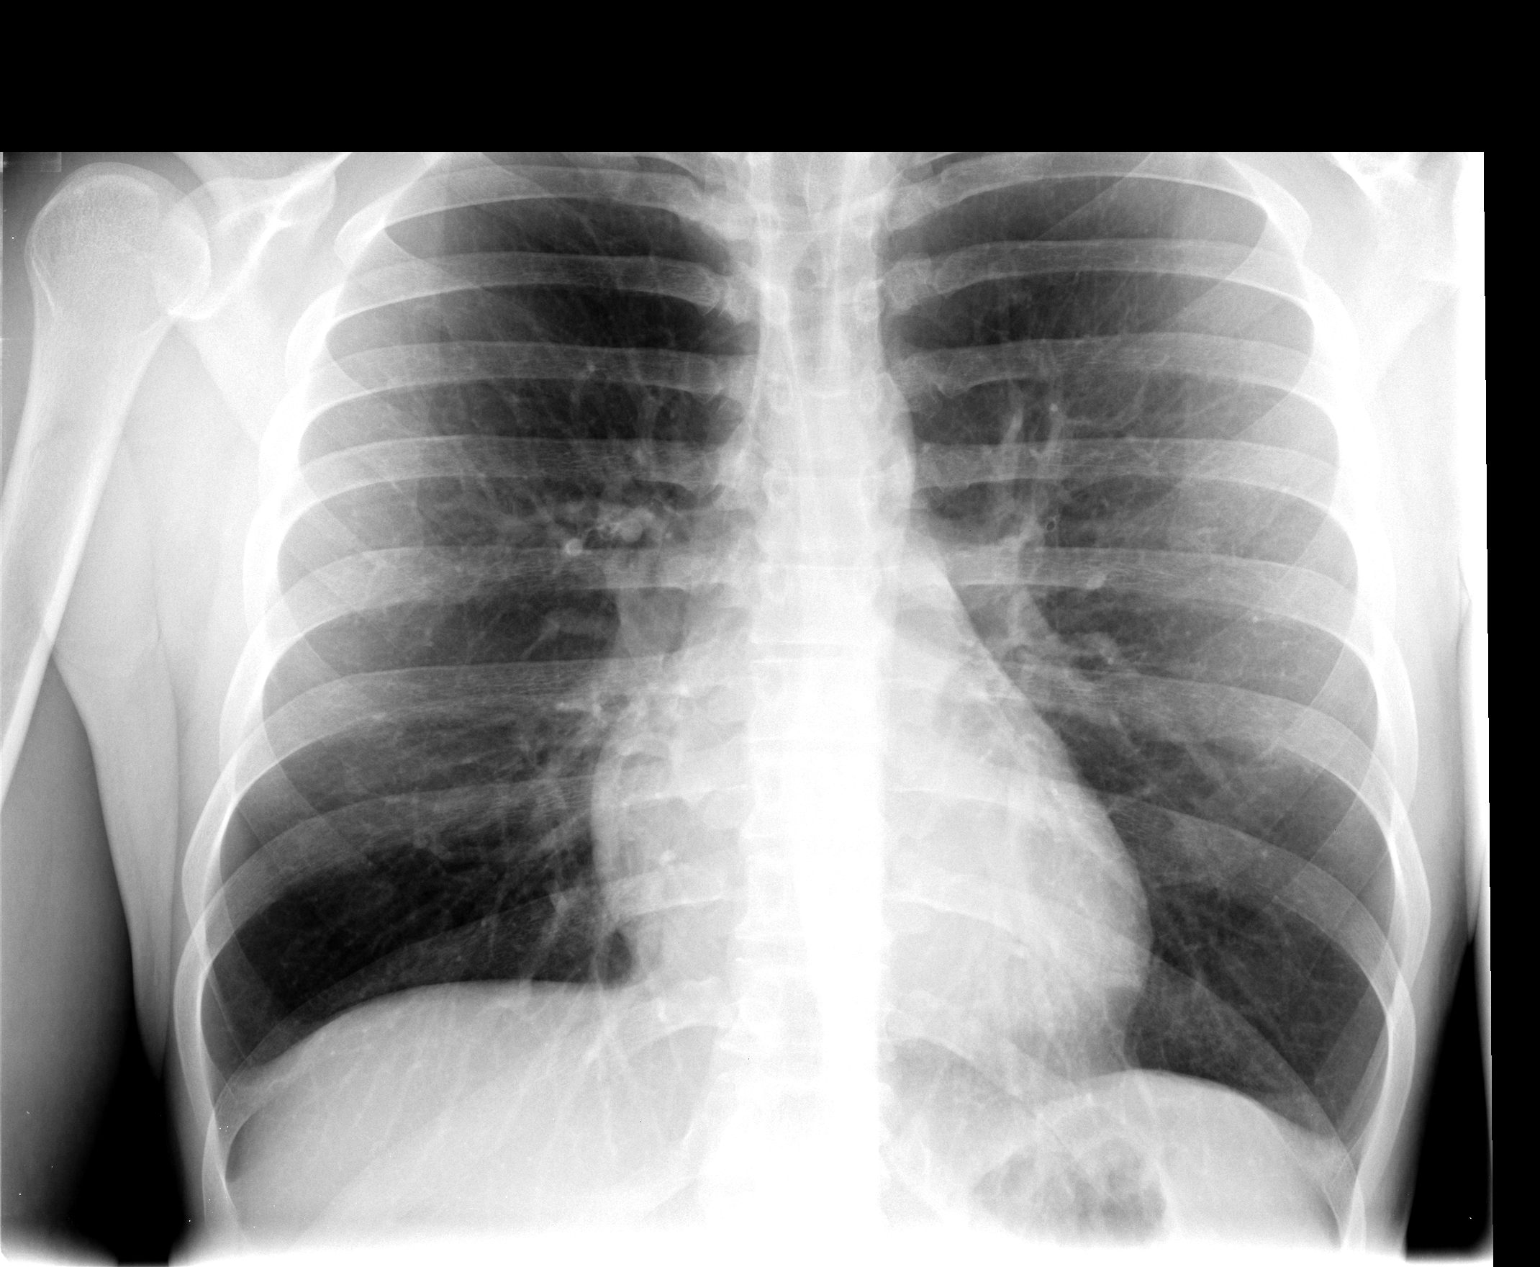

[view not recorded (2 of 2)]
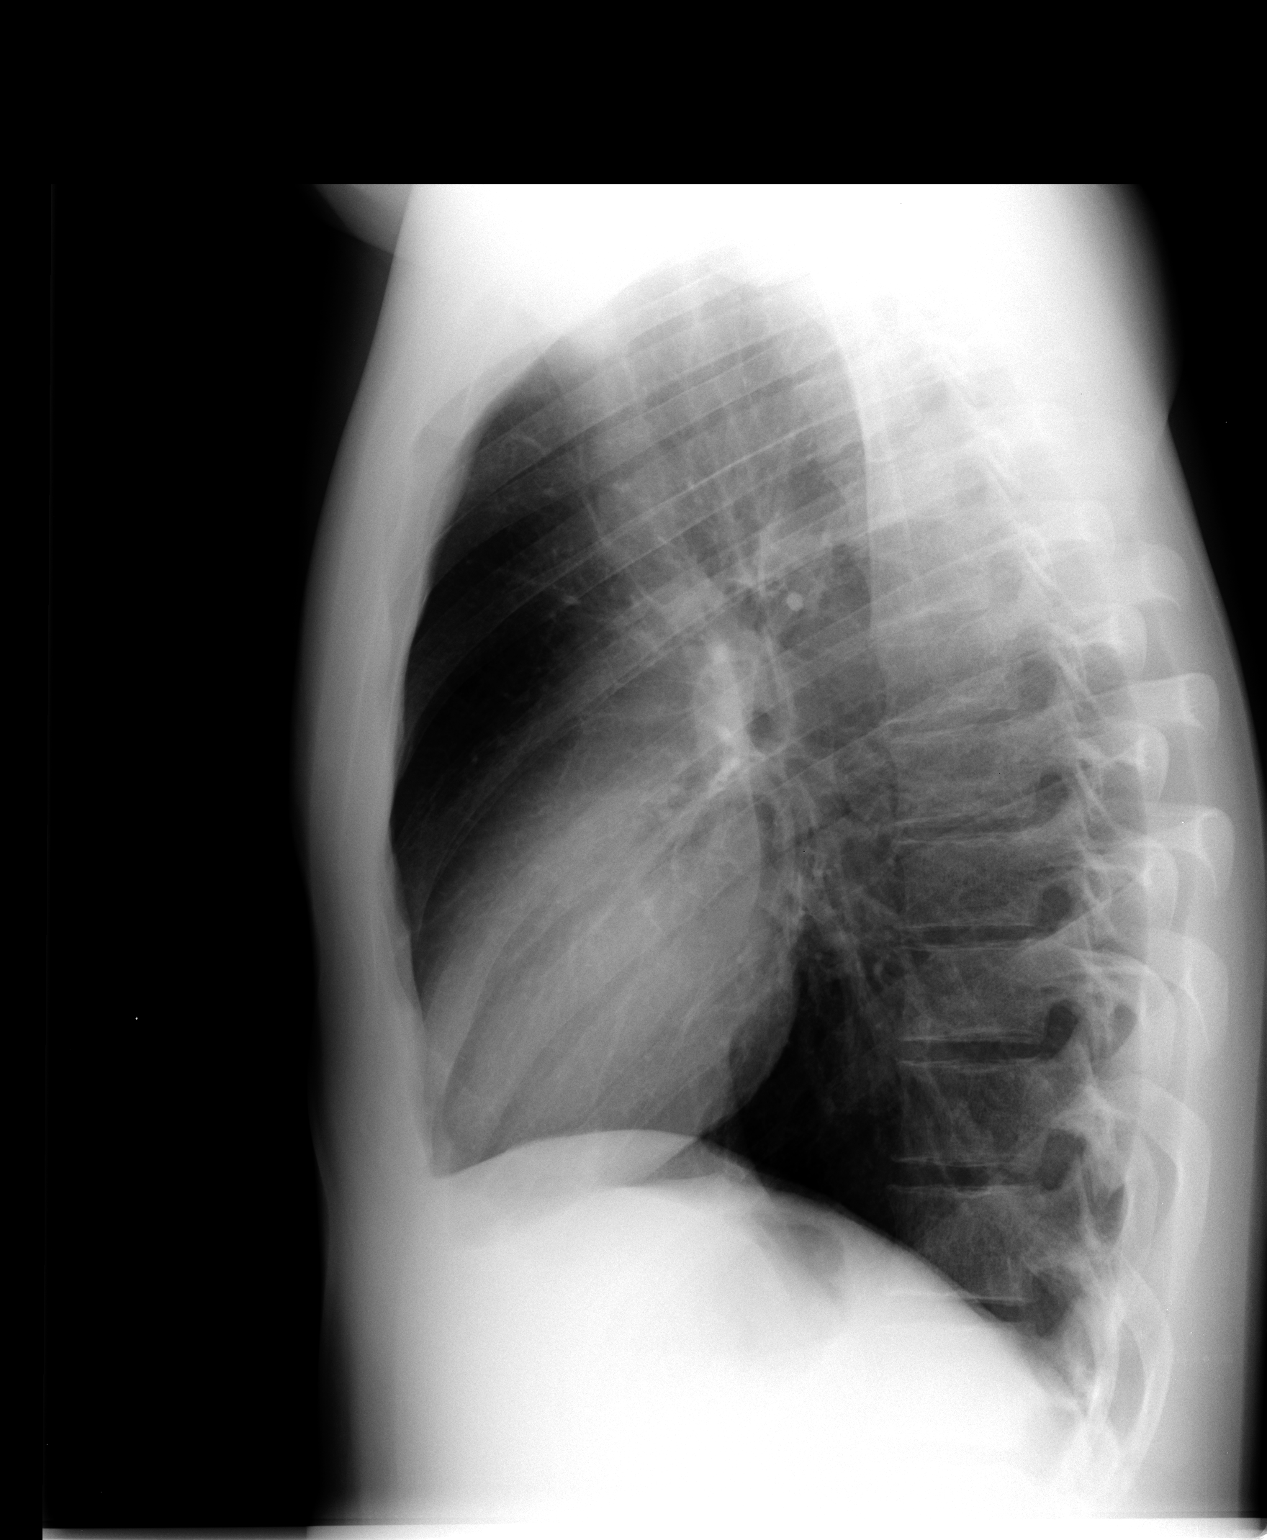

[2 of 2 positions shown; findings below may reference images not displayed]

FINDINGS: The heart size and mediastinal contours are within normal limits.
Both lungs are clear. No evidence of pleural effusion. Old right
clavicle fracture deformity incidentally noted.
IMPRESSION: No active cardiopulmonary disease.
